# Patient Record
Sex: Male | Born: 1990 | Race: White | Hispanic: No | Marital: Single | Smoking: Never smoker
Health system: Southern US, Community
[De-identification: ages and names within clinical notes are randomized; demographics above are authoritative.]

## PROBLEM LIST (undated history)

## (undated) DIAGNOSIS — R569 Unspecified convulsions: Secondary | ICD-10-CM

## (undated) DIAGNOSIS — G40909 Epilepsy, unspecified, not intractable, without status epilepticus: Secondary | ICD-10-CM

## (undated) HISTORY — DX: Epilepsy, unspecified, not intractable, without status epilepticus: G40.909

## (undated) HISTORY — PX: TONSILLECTOMY: SUR1361

## (undated) HISTORY — PX: TYMPANOSTOMY TUBE PLACEMENT: SHX32

---
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
## 2017-10-07 ENCOUNTER — Encounter (HOSPITAL_BASED_OUTPATIENT_CLINIC_OR_DEPARTMENT_OTHER): Payer: Self-pay | Admitting: Emergency Medicine

## 2017-10-07 ENCOUNTER — Other Ambulatory Visit: Payer: Self-pay

## 2017-10-07 ENCOUNTER — Observation Stay (HOSPITAL_COMMUNITY): Payer: Self-pay

## 2017-10-07 ENCOUNTER — Emergency Department (HOSPITAL_BASED_OUTPATIENT_CLINIC_OR_DEPARTMENT_OTHER): Payer: Self-pay

## 2017-10-07 ENCOUNTER — Inpatient Hospital Stay (HOSPITAL_BASED_OUTPATIENT_CLINIC_OR_DEPARTMENT_OTHER)
Admission: EM | Admit: 2017-10-07 | Discharge: 2017-10-12 | DRG: 193 | Disposition: A | Discharge status: Home / Self Care | Payer: Self-pay | Attending: Internal Medicine | Admitting: Internal Medicine

## 2017-10-07 DIAGNOSIS — J9691 Respiratory failure, unspecified with hypoxia: Secondary | ICD-10-CM | POA: Diagnosis present

## 2017-10-07 DIAGNOSIS — R739 Hyperglycemia, unspecified: Secondary | ICD-10-CM

## 2017-10-07 DIAGNOSIS — Z8619 Personal history of other infectious and parasitic diseases: Secondary | ICD-10-CM

## 2017-10-07 DIAGNOSIS — R001 Bradycardia, unspecified: Secondary | ICD-10-CM

## 2017-10-07 DIAGNOSIS — R7303 Prediabetes: Secondary | ICD-10-CM | POA: Diagnosis present

## 2017-10-07 DIAGNOSIS — R945 Abnormal results of liver function studies: Secondary | ICD-10-CM | POA: Diagnosis present

## 2017-10-07 DIAGNOSIS — R0602 Shortness of breath: Secondary | ICD-10-CM

## 2017-10-07 DIAGNOSIS — Z881 Allergy status to other antibiotic agents status: Secondary | ICD-10-CM

## 2017-10-07 DIAGNOSIS — D6959 Other secondary thrombocytopenia: Secondary | ICD-10-CM | POA: Diagnosis present

## 2017-10-07 DIAGNOSIS — R0600 Dyspnea, unspecified: Secondary | ICD-10-CM

## 2017-10-07 DIAGNOSIS — R7989 Other specified abnormal findings of blood chemistry: Secondary | ICD-10-CM

## 2017-10-07 DIAGNOSIS — Z79899 Other long term (current) drug therapy: Secondary | ICD-10-CM

## 2017-10-07 DIAGNOSIS — J9601 Acute respiratory failure with hypoxia: Secondary | ICD-10-CM

## 2017-10-07 DIAGNOSIS — K5900 Constipation, unspecified: Secondary | ICD-10-CM | POA: Diagnosis present

## 2017-10-07 DIAGNOSIS — J181 Lobar pneumonia, unspecified organism: Principal | ICD-10-CM

## 2017-10-07 DIAGNOSIS — Z888 Allergy status to other drugs, medicaments and biological substances status: Secondary | ICD-10-CM

## 2017-10-07 DIAGNOSIS — J189 Pneumonia, unspecified organism: Secondary | ICD-10-CM

## 2017-10-07 DIAGNOSIS — D696 Thrombocytopenia, unspecified: Secondary | ICD-10-CM

## 2017-10-07 DIAGNOSIS — G40909 Epilepsy, unspecified, not intractable, without status epilepticus: Secondary | ICD-10-CM | POA: Diagnosis present

## 2017-10-07 HISTORY — DX: Unspecified convulsions: R56.9

## 2017-10-07 LAB — COMPREHENSIVE METABOLIC PANEL
ALBUMIN: 3.5 g/dL (ref 3.5–5.0)
ALT: 249 U/L — AB (ref 0–44)
AST: 245 U/L — AB (ref 15–41)
Alkaline Phosphatase: 54 U/L (ref 38–126)
Anion gap: 15 (ref 5–15)
BILIRUBIN TOTAL: 0.8 mg/dL (ref 0.3–1.2)
BUN: 12 mg/dL (ref 6–20)
CO2: 27 mmol/L (ref 22–32)
CREATININE: 0.94 mg/dL (ref 0.61–1.24)
Calcium: 8.3 mg/dL — ABNORMAL LOW (ref 8.9–10.3)
Chloride: 89 mmol/L — ABNORMAL LOW (ref 98–111)
Glucose, Bld: 124 mg/dL — ABNORMAL HIGH (ref 70–99)
POTASSIUM: 3.1 mmol/L — AB (ref 3.5–5.1)
Sodium: 131 mmol/L — ABNORMAL LOW (ref 135–145)
TOTAL PROTEIN: 7.5 g/dL (ref 6.5–8.1)

## 2017-10-07 LAB — URINALYSIS, ROUTINE W REFLEX MICROSCOPIC
Bilirubin Urine: NEGATIVE
Glucose, UA: NEGATIVE mg/dL
Hgb urine dipstick: NEGATIVE
KETONES UR: NEGATIVE mg/dL
Leukocytes, UA: NEGATIVE
NITRITE: NEGATIVE
PROTEIN: NEGATIVE mg/dL
Specific Gravity, Urine: <1.005 — ABNORMAL LOW (ref 1.005–1.030)
pH: 7 (ref 5.0–8.0)

## 2017-10-07 LAB — BLOOD GAS, ARTERIAL
ACID-BASE EXCESS: 6.9 mmol/L — AB (ref 0.0–2.0)
Bicarbonate: 31.3 mmol/L — ABNORMAL HIGH (ref 20.0–28.0)
Drawn by: 331471
FIO2: 55
O2 SAT: 92.3 %
PCO2 ART: 44.1 mmHg (ref 32.0–48.0)
Patient temperature: 37
pH, Arterial: 7.465 — ABNORMAL HIGH (ref 7.350–7.450)
pO2, Arterial: 63.6 mmHg — ABNORMAL LOW (ref 83.0–108.0)

## 2017-10-07 LAB — CBC WITH DIFFERENTIAL/PLATELET
BASOS ABS: 0.1 10*3/uL (ref 0.0–0.1)
Basophils Relative: 1 %
Eosinophils Absolute: 0 10*3/uL (ref 0.0–0.7)
Eosinophils Relative: 0 %
HCT: 42.4 % (ref 39.0–52.0)
Hemoglobin: 15.3 g/dL (ref 13.0–17.0)
LYMPHS ABS: 1.2 10*3/uL (ref 0.7–4.0)
Lymphocytes Relative: 18 %
MCH: 30.8 pg (ref 26.0–34.0)
MCHC: 36.1 g/dL — ABNORMAL HIGH (ref 30.0–36.0)
MCV: 85.3 fL (ref 78.0–100.0)
MONO ABS: 0.8 10*3/uL (ref 0.1–1.0)
Monocytes Relative: 13 %
NEUTROS PCT: 68 %
Neutro Abs: 4.4 10*3/uL (ref 1.7–7.7)
PLATELETS: 117 10*3/uL — AB (ref 150–400)
RBC: 4.97 MIL/uL (ref 4.22–5.81)
RDW: 12.2 % (ref 11.5–15.5)
WBC: 6.5 10*3/uL (ref 4.0–10.5)

## 2017-10-07 LAB — EXPECTORATED SPUTUM ASSESSMENT W REFEX TO RESP CULTURE

## 2017-10-07 LAB — MRSA PCR SCREENING: MRSA by PCR: NEGATIVE

## 2017-10-07 LAB — EXPECTORATED SPUTUM ASSESSMENT W GRAM STAIN, RFLX TO RESP C

## 2017-10-07 LAB — VALPROIC ACID LEVEL: Valproic Acid Lvl: 67 ug/mL (ref 50.0–100.0)

## 2017-10-07 LAB — I-STAT CG4 LACTIC ACID, ED: Lactic Acid, Venous: 1.58 mmol/L (ref 0.5–1.9)

## 2017-10-07 MED ORDER — PHENOL 1.4 % MT LIQD
1.0000 | OROMUCOSAL | Status: DC | PRN
  Filled 2017-10-07: qty 177

## 2017-10-07 MED ORDER — VANCOMYCIN HCL IN DEXTROSE 1-5 GM/200ML-% IV SOLN
1000.0000 mg | Freq: Three times a day (TID) | INTRAVENOUS | Status: DC
  Administered 2017-10-07 – 2017-10-08 (×3): 1000 mg via INTRAVENOUS
  Filled 2017-10-07 (×3): qty 200

## 2017-10-07 MED ORDER — DIVALPROEX SODIUM ER 500 MG PO TB24
1500.0000 mg | ORAL_TABLET | Freq: Every day | ORAL | Status: DC
  Administered 2017-10-07 – 2017-10-11 (×5): 1500 mg via ORAL
  Filled 2017-10-07: qty 3
  Filled 2017-10-07: qty 6
  Filled 2017-10-07: qty 3
  Filled 2017-10-07 (×2): qty 6

## 2017-10-07 MED ORDER — IPRATROPIUM-ALBUTEROL 0.5-2.5 (3) MG/3ML IN SOLN
3.0000 mL | Freq: Once | RESPIRATORY_TRACT | Status: AC
  Administered 2017-10-07: 3 mL via RESPIRATORY_TRACT

## 2017-10-07 MED ORDER — LACTATED RINGERS IV BOLUS
1000.0000 mL | Freq: Once | INTRAVENOUS | Status: AC
  Administered 2017-10-07: 1000 mL via INTRAVENOUS

## 2017-10-07 MED ORDER — VANCOMYCIN HCL IN DEXTROSE 1-5 GM/200ML-% IV SOLN
1000.0000 mg | INTRAVENOUS | Status: AC
  Administered 2017-10-07: 1000 mg via INTRAVENOUS

## 2017-10-07 MED ORDER — SODIUM CHLORIDE 0.9 % IV SOLN
INTRAVENOUS | Status: DC
  Administered 2017-10-07: 21:00:00 via INTRAVENOUS
  Administered 2017-10-08: 1000 mL via INTRAVENOUS
  Administered 2017-10-09: 05:00:00 via INTRAVENOUS

## 2017-10-07 MED ORDER — ENOXAPARIN SODIUM 40 MG/0.4ML ~~LOC~~ SOLN
40.0000 mg | SUBCUTANEOUS | Status: DC
  Administered 2017-10-11: 40 mg via SUBCUTANEOUS
  Filled 2017-10-07 (×4): qty 0.4

## 2017-10-07 MED ORDER — POTASSIUM CHLORIDE CRYS ER 20 MEQ PO TBCR
40.0000 meq | EXTENDED_RELEASE_TABLET | Freq: Once | ORAL | Status: AC
  Administered 2017-10-07: 40 meq via ORAL
  Filled 2017-10-07: qty 2

## 2017-10-07 MED ORDER — PREDNISONE 20 MG PO TABS
20.0000 mg | ORAL_TABLET | Freq: Two times a day (BID) | ORAL | Status: AC
  Administered 2017-10-07 – 2017-10-12 (×10): 20 mg via ORAL
  Filled 2017-10-07 (×10): qty 1

## 2017-10-07 MED ORDER — ONDANSETRON HCL 4 MG/2ML IJ SOLN: 4.0000 mg | Freq: Four times a day (QID) | INTRAMUSCULAR | Status: DC | PRN

## 2017-10-07 MED ORDER — LEVOFLOXACIN IN D5W 750 MG/150ML IV SOLN
750.0000 mg | Freq: Once | INTRAVENOUS | Status: DC
  Filled 2017-10-07: qty 150

## 2017-10-07 MED ORDER — VANCOMYCIN HCL IN DEXTROSE 1-5 GM/200ML-% IV SOLN
1000.0000 mg | Freq: Once | INTRAVENOUS | Status: DC
  Filled 2017-10-07: qty 200

## 2017-10-07 MED ORDER — DIVALPROEX SODIUM ER 500 MG PO TB24
1000.0000 mg | ORAL_TABLET | Freq: Two times a day (BID) | ORAL | Status: DC
  Administered 2017-10-07 – 2017-10-12 (×10): 1000 mg via ORAL
  Filled 2017-10-07: qty 4
  Filled 2017-10-07 (×3): qty 2
  Filled 2017-10-07 (×6): qty 4

## 2017-10-07 MED ORDER — SODIUM CHLORIDE 0.9 % IV SOLN: INTRAVENOUS | Status: DC | PRN

## 2017-10-07 MED ORDER — ACETAMINOPHEN 650 MG RE SUPP: 650.0000 mg | Freq: Four times a day (QID) | RECTAL | Status: DC | PRN

## 2017-10-07 MED ORDER — IPRATROPIUM-ALBUTEROL 0.5-2.5 (3) MG/3ML IN SOLN: 3.0000 mL | RESPIRATORY_TRACT | Status: DC | PRN

## 2017-10-07 MED ORDER — GUAIFENESIN-CODEINE 100-10 MG/5ML PO SOLN
5.0000 mL | ORAL | Status: DC | PRN
  Administered 2017-10-07 – 2017-10-11 (×15): 5 mL via ORAL
  Filled 2017-10-07 (×15): qty 5

## 2017-10-07 MED ORDER — LEVETIRACETAM 500 MG PO TABS
1500.0000 mg | ORAL_TABLET | Freq: Every day | ORAL | Status: DC
  Administered 2017-10-08 – 2017-10-12 (×5): 1500 mg via ORAL
  Filled 2017-10-07 (×7): qty 3

## 2017-10-07 MED ORDER — ACETAMINOPHEN 325 MG PO TABS: 650.0000 mg | ORAL_TABLET | Freq: Four times a day (QID) | ORAL | Status: DC | PRN

## 2017-10-07 MED ORDER — MENTHOL 3 MG MT LOZG
1.0000 | LOZENGE | OROMUCOSAL | Status: DC | PRN
  Administered 2017-10-08 – 2017-10-09 (×2): 3 mg via ORAL
  Filled 2017-10-07 (×2): qty 9

## 2017-10-07 MED ORDER — ORAL CARE MOUTH RINSE
15.0000 mL | Freq: Two times a day (BID) | OROMUCOSAL | Status: DC
  Administered 2017-10-07 – 2017-10-09 (×3): 15 mL via OROMUCOSAL

## 2017-10-07 MED ORDER — ACETAMINOPHEN 500 MG PO TABS
1000.0000 mg | ORAL_TABLET | Freq: Once | ORAL | Status: AC
  Administered 2017-10-07: 1000 mg via ORAL
  Filled 2017-10-07: qty 2

## 2017-10-07 MED ORDER — LEVOFLOXACIN IN D5W 750 MG/150ML IV SOLN
750.0000 mg | INTRAVENOUS | Status: DC
  Administered 2017-10-08: 750 mg via INTRAVENOUS
  Filled 2017-10-07: qty 150

## 2017-10-07 MED ORDER — LEVOFLOXACIN IN D5W 750 MG/150ML IV SOLN
750.0000 mg | INTRAVENOUS | Status: AC
  Administered 2017-10-07: 750 mg via INTRAVENOUS

## 2017-10-07 MED ORDER — LEVETIRACETAM 500 MG PO TABS
1000.0000 mg | ORAL_TABLET | ORAL | Status: DC
  Administered 2017-10-07 – 2017-10-11 (×10): 1000 mg via ORAL
  Filled 2017-10-07 (×8): qty 2

## 2017-10-07 NOTE — Consult Note (Addendum)
NAME:  John Mccoy, MRN:  [id number], DOB:  03/04/1990, LOS: 0 ADMISSION DATE:  10/07/2017, CONSULTATION DATE: 10/07/2017 REFERRING MD: Dr. Elvera LennoxGherghe, CHIEF COMPLAINT: Shortness of breath  Brief History   Patient was admitted with worsening shortness of breath Was recently treated for influenza B--completed a course of Tamiflu His symptoms of sore throat, cough, shortness of breath continue to persist Ongoing fevers chills led to him presenting to the emergency department Was noted to be tachycardic and tachypneic Significant Hospital Events   Continue to deteriorate in the emergency department requiring nonrebreather mask He had initially started on nasal cannula  Consults: date of consult/date signed off & final recs:   Procedures (surgical and bedside):  None Significant Diagnostic Tests:  ABG noted for significant hypoxemia  Chest x-ray reviewed by myself showing multifocal basal infiltrates  Micro Data: Blood culture x2 on 10/07/2017  Antimicrobials:  Vancomycin 10/07/2017>> Levaquin 10/07/2017>>  Subjective:  He feels relatively well Still has nausea Feels quite fatigued  Objective   Blood pressure 132/76, pulse 83, temperature 99.5 F (37.5 C), temperature source Oral, resp. rate (!) 25, height 6\' 1"  (1.854 m), weight 102.1 kg, SpO2 94 %.    FiO2 (%):  [50 %-100 %] 100 %   Intake/Output Summary (Last 24 hours) at 10/07/2017 1701 Last data filed at 10/07/2017 1600 Gross per 24 hour  Intake 1150.81 ml  Output 950 ml  Net 200.81 ml   Filed Weights   10/07/17 0800  Weight: 102.1 kg    Examination: General: Young gentleman, does not appear to be in respiratory distress HENT: Moist oral mucosa Lungs: Good entry bilaterally, rales at the bases Cardiovascular: S1-S2 appreciated, no murmur Abdomen: Bowel sounds appreciated Extremities: No edema, no clubbing Neuro: Alert oriented x3, moving all extremities GU: Good urine output  Resolved Hospital Problem  list    Assessment & Plan:   Hypoxemic respiratory failure Recent influenza infection Possible bacterial infection Extensive infiltrate   Multilobar pneumonia Continue dual antibiotics, vancomycin and Levaquin  Influenza B infection Continue Tamiflu  Seizure disorder Continue seizure  meds    For his hypoxemia/pneumonitis Add steroids-prednisone 20 p.o. twice daily  Wean oxygen as tolerated  Risk of decompensation is high as patient had to be escalated from requiring liters nasal cannula up to using nonrebreather mask to maintain saturations over 90%  Risk of requiring a ventilator does exist-will continue to monitor closely  Disposition / Summary of Today's Plan 10/07/17   Added steroids Continue antibiotics    Diet: As tolerated Pain/Anxiety/Delirium protocol (if indicated): Not applicable VAP protocol (if indicated) not applicable DVT prophylaxis: May ambulate as tolerated GI prophylaxis: Tolerating orally Hyperglycemia protocol: Applicable Mobility: As tolerated Code Status: full Code  Labs   CBC: Recent Labs  Lab 10/07/17 0802  WBC 6.5  NEUTROABS 4.4  HGB 15.3  HCT 42.4  MCV 85.3  PLT 117*   Basic Metabolic Panel: Recent Labs  Lab 10/07/17 0802  NA 131*  K 3.1*  CL 89*  CO2 27  GLUCOSE 124*  BUN 12  CREATININE 0.94  CALCIUM 8.3*   GFR: Estimated Creatinine Clearance: 149.6 mL/min (by C-G formula based on SCr of 0.94 mg/dL). Recent Labs  Lab 10/07/17 0802  WBC 6.5  LATICACIDVEN 1.58   Liver Function Tests: Recent Labs  Lab 10/07/17 0802  AST 245*  ALT 249*  ALKPHOS 54  BILITOT 0.8  PROT 7.5  ALBUMIN 3.5   No results for input(s): LIPASE, AMYLASE in the last 168 hours. No  results for input(s): AMMONIA in the last 168 hours. ABG    Component Value Date/Time   PHART 7.465 (H) 10/07/2017 1601   PCO2ART 44.1 10/07/2017 1601   PO2ART 63.6 (L) 10/07/2017 1601   HCO3 31.3 (H) 10/07/2017 1601   O2SAT 92.3 10/07/2017 1601      Coagulation Profile: No results for input(s): INR, PROTIME in the last 168 hours. Cardiac Enzymes: No results for input(s): CKTOTAL, CKMB, CKMBINDEX, TROPONINI in the last 168 hours. HbA1C: No results found for: HGBA1C CBG: No results for input(s): GLUCAP in the last 168 hours.  Admitting History of Present Illness.   As above Review of Systems:   Review of Systems  Constitutional: Positive for fever and malaise/fatigue.  Respiratory: Positive for cough and shortness of breath.   All other systems reviewed and are negative.   Past medical history  He,  has a past medical history of Seizures (HCC).   Surgical History   History reviewed. No pertinent surgical history.   Social History   Social History   Socioeconomic History  . Marital status: Single    Spouse name: Not on file  . Number of children: Not on file  . Years of education: Not on file  . Highest education level: Not on file  Occupational History  . Not on file  Social Needs  . Financial resource strain: Not on file  . Food insecurity:    Worry: Not on file    Inability: Not on file  . Transportation needs:    Medical: Not on file    Non-medical: Not on file  Tobacco Use  . Smoking status: Never Smoker  . Smokeless tobacco: Never Used  Substance and Sexual Activity  . Alcohol use: Not Currently  . Drug use: Not Currently  . Sexual activity: Not on file  Lifestyle  . Physical activity:    Days per week: Not on file    Minutes per session: Not on file  . Stress: Not on file  Relationships  . Social connections:    Talks on phone: Not on file    Gets together: Not on file    Attends religious service: Not on file    Active member of club or organization: Not on file    Attends meetings of clubs or organizations: Not on file    Relationship status: Not on file  . Intimate partner violence:    Fear of current or ex partner: Not on file    Emotionally abused: Not on file    Physically abused:  Not on file    Forced sexual activity: Not on file  Other Topics Concern  . Not on file  Social History Narrative  . Not on file  ,  reports that he has never smoked. He has never used smokeless tobacco. He reports that he drank alcohol. He reports that he has current or past drug history.   Family history   His family history is not on file.   Allergies Allergies  Allergen Reactions  . Other Swelling    Eyes, nose & mouth swell shut NSAIDS  . Daucus Carota Itching and Swelling    Also Cantalope; Celery  . Azithromycin Rash    Home meds  Prior to Admission medications   Medication Sig Start Date End Date Taking? Authorizing Provider  divalproex (DEPAKOTE ER) 500 MG 24 hr tablet Take 1,000-1,500 mg by mouth 3 (three) times daily. Per Pharmacy 1000mg  QAM, 1000mg  midday, 1500mg  QPM Per Pt 1500mg   QAM, 1000mg  midday, 1500mg  QPM 02/23/17  Yes [provider]  levETIRAcetam (KEPPRA) 1000 MG tablet Take 1,000-1,500 mg by mouth 3 (three) times daily. 1500mg  QAM & 1000mg  Q afternoon & 1000mg  QPM 07/19/17  Yes [provider]  oseltamivir (TAMIFLU) 75 MG capsule Take 75 mg by mouth 2 (two) times daily.  09/30/17   [provider]

## 2017-10-07 NOTE — Progress Notes (Signed)
Pharmacy Antibiotic Note  John Mccoy is a 27 y.o. male admitted on 10/07/2017 with sepsis.  Pharmacy has been consulted for Vanco/Levaquin dosing.  CC/HPI: Flu last week, now worsening with continued fever, hypoxia, increased WOB and SOB, weakness, dizziness  PMH: seizures,   Anticoag: Plts only 117 ID: Temp 103.1. WBC 6.5.  GI/Nutrition: AST/ALT 245/249 elevated. Tbili WNL. Albumin 3.5 ok. Neuro: 9/21 VA level 67 Renal: Scr 0.94. K 3.1 low  Plan: Vanco 1g IV x 1 then q 8 hrs Levaquin 750mg  IV x 1 and q 24h   Height: 6\' 1"  (185.4 cm) Weight: 225 lb (102.1 kg) IBW/kg (Calculated) : 79.9  Temp (24hrs), Avg:103.1 F (39.5 C), Min:103.1 F (39.5 C), Max:103.1 F (39.5 C)  Recent Labs  Lab 10/07/17 0802  WBC 6.5  CREATININE 0.94  LATICACIDVEN 1.58    Estimated Creatinine Clearance: 149.6 mL/min (by C-G formula based on SCr of 0.94 mg/dL).    Allergies  Allergen Reactions  . Other Swelling    Eyes, nose & mouth swell shut  . Daucus Carota Itching and Swelling    Also Cantalope; Celery  . Azithromycin Rash    John Mccoy, PharmD, BCPS Clinical Staff Pharmacist [phone]  John Mccoy, John Mccoy [facility] 10/07/2017 10:09 AM

## 2017-10-07 NOTE — H&P (Addendum)
History and Physical    John Mccoy John Mccoy DOB: 01/11/1991 DOA: 10/07/2017  I [facility] briefly [facility]  PCP: Patient, No Pcp Per  Patient coming from: home  Chief Complaint: shortness of breath   HPI: John Mccoy is a 27 y.o. male with medical history significant of seizure disorder  On keppra and depakote, otherwise healthy presents to the ER with complaints of shortness of breath. Patient had URI last week and was diagnosed with influenza B at a urgent care and was placed on Tamiflu. He took it as prescribed and finished a 5 day course. Despite the antiviral, his URI / sore throat / cough persisted, he continued to feel poorly and started being very short of breath and came to the hospital. He also reports crampy abdominal pain and diarrhea for the past 4-5 days. No nausea or vomiting. He complains of fevers and chills, ongoing muscle aches and weakness/fatigue.   ED Course: On arrival to the ED he is febrile, tachycardic and hypoxic requiring initially nasal cannula, then ventimask and eventually needed to be placed on a NRB with good O2 sats. CXR revealed multifocal PNA. He was placed on Vancomycin and Levaquin and we were asked to admit.   Review of Systems: As per HPI otherwise 10 point review of systems negative.   Past Medical History:  Diagnosis Date  . Seizures (HCC)     History reviewed. No pertinent surgical history.   reports that he has never smoked. He has never used smokeless tobacco. He reports that he drank alcohol. He reports that he has current or past drug history.  Allergies  Allergen Reactions  . Other Swelling    Eyes, nose & mouth swell shut  . Daucus Carota Itching and Swelling    Also Cantalope; Celery  . Azithromycin Rash    History reviewed. No pertinent family history.  Prior to Admission medications   Medication Sig Start Date End Date Taking? Authorizing Provider    divalproex (DEPAKOTE ER) 500 MG 24 hr tablet Take 2 pills in morning , 2 pills at midday and 3 pills at night. Please use the [employer] manufacturer each time as medically necessary. 02/23/17  Yes [provider]  b complex vitamins tablet Take by mouth.    [provider]  divalproex (DEPAKOTE ER) 500 MG 24 hr tablet  07/12/17   [provider]  oseltamivir (TAMIFLU) 75 MG capsule TK 1 C PO BID           TK ONE C PO BID 09/30/17   [provider]    Physical Exam: Vitals:   10/07/17 1330 10/07/17 1400 10/07/17 1430 10/07/17 1534  BP: 124/87 116/71 117/70 132/76  Pulse: 76 76 73 85  Resp: (!) 24 (!) 36 (!) 24 (!) 28  Temp:    99.5 F (37.5 C)  TempSrc:    Oral  SpO2: 96% 96% 99% 94%  Weight:      Height:        Constitutional: Patient in mild distress, tachypneic, appears tired Eyes: PERRL, lids and conjunctivae normal ENMT: Mucous membranes are moist. Posterior pharynx erythematous Neck: normal, supple, no masses, no thyromegaly Respiratory: Coarse breath sounds bilaterally, no wheezing or crackles heard.  Increased respiratory effort Cardiovascular: Regular rate and rhythm, no murmurs / rubs / gallops.  Tachycardic.  No extremity edema. 2+ pedal pulses.  Abdomen: no tenderness, no masses palpated. Bowel sounds positive.  Musculoskeletal: no clubbing /  cyanosis. Normal muscle tone.  Skin: no rashes, lesions, ulcers. No induration Neurologic: CN 2-12 grossly intact. Strength 5/5 in all 4.  Psychiatric: Normal judgment and insight. Alert and oriented x 3. Normal mood.   Labs on Admission: I have personally reviewed following labs and imaging studies  CBC: Recent Labs  Lab 10/07/17 0802  WBC 6.5  NEUTROABS 4.4  HGB 15.3  HCT 42.4  MCV 85.3  PLT 117*   Basic Metabolic Panel: Recent Labs  Lab 10/07/17 0802  NA 131*  K 3.1*  CL 89*  CO2 27  GLUCOSE 124*  BUN 12  CREATININE 0.94  CALCIUM 8.3*   GFR: Estimated  Creatinine Clearance: 149.6 mL/min (by C-G formula based on SCr of 0.94 mg/dL). Liver Function Tests: Recent Labs  Lab 10/07/17 0802  AST 245*  ALT 249*  ALKPHOS 54  BILITOT 0.8  PROT 7.5  ALBUMIN 3.5   No results for input(s): LIPASE, AMYLASE in the last 168 hours. No results for input(s): AMMONIA in the last 168 hours. Coagulation Profile: No results for input(s): INR, PROTIME in the last 168 hours. Cardiac Enzymes: No results for input(s): CKTOTAL, CKMB, CKMBINDEX, TROPONINI in the last 168 hours. BNP (last 3 results) No results for input(s): PROBNP in the last 8760 hours. HbA1C: No results for input(s): HGBA1C in the last 72 hours. CBG: No results for input(s): GLUCAP in the last 168 hours. Lipid Profile: No results for input(s): CHOL, HDL, LDLCALC, TRIG, CHOLHDL, LDLDIRECT in the last 72 hours. Thyroid Function Tests: No results for input(s): TSH, T4TOTAL, FREET4, T3FREE, THYROIDAB in the last 72 hours. Anemia Panel: No results for input(s): VITAMINB12, FOLATE, FERRITIN, TIBC, IRON, RETICCTPCT in the last 72 hours. Urine analysis:    Component Value Date/Time   COLORURINE YELLOW 10/07/2017 0744   APPEARANCEUR CLEAR 10/07/2017 0744   LABSPEC <1.005 (L) 10/07/2017 0744   PHURINE 7.0 10/07/2017 0744   GLUCOSEU NEGATIVE 10/07/2017 0744   HGBUR NEGATIVE 10/07/2017 0744   BILIRUBINUR NEGATIVE 10/07/2017 0744   KETONESUR NEGATIVE 10/07/2017 0744   PROTEINUR NEGATIVE 10/07/2017 0744   NITRITE NEGATIVE 10/07/2017 0744   LEUKOCYTESUR NEGATIVE 10/07/2017 0744     Radiological Exams on Admission: Dg Chest 2 View  Result Date: 10/07/2017 CLINICAL DATA:  Flu. Fever, increased work of breathing, shortness of breath, hypoxia. EXAM: CHEST - 2 VIEW COMPARISON:  None. FINDINGS: Multifocal patchy opacities in the lingula and bilateral lower lobes, suspicious for pneumonia. No pleural effusion or pneumothorax. The heart is normal in size. Visualized osseous structures are within  normal limits. IMPRESSION: Multifocal pneumonia, left lung predominant. Electronically Signed   By: Charline Bills M.D.   On: 10/07/2017 08:40    EKG: Independently reviewed. Sinus tach  Assessment/Plan Active Problems:   Pneumonia   Sepsis due to multifocal pneumonia -Patient recently diagnosed with influenza and finished a course of Tamiflu.  There is no role to continue Tamiflu at this point, obtain respiratory virus panel to evaluate for potential additional viral coinfection -Placed on broad-spectrum antibiotics of vancomycin and Levaquin, continue  Acute hypoxic respiratory failure -Concern for progressing to ARDS, ABG shows profound hypoxia with an oxygen level of 63 on 100% nonrebreather.   -later after admission was able to be weaned to high flow, he is comfortable -pulmonary evaluated patient, appreciate input  Elevated LFTs  -possibly post influenza, repeat in am. No RUQ tenderness. No ETOH.  Seizure disorder -continue home medications   DVT prophylaxis: Lovenox  Code Status: Full code  Family Communication:  no family at bedside Disposition Plan: TBD Consults called: PCCM     Admission status: Inpatient    At the time of admission, it appears that the appropriate admission status for this patient is INPATIENT. This is judged to be reasonable and necessary in order to provide the required high service intensity to ensure the patient's safety given the presenting symptoms, physical exam findings, and initial radiographic and laboratory data in the context of their chronic comorbidities. Current circumstances are hypoxic respiratory failure, pneumonia, influenza, and it is felt to place patient at high risk for further clinical deterioration threatening life, limb, or organ. Moreover, it is my clinical judgment that the patient will require inpatient hospital care spanning beyond 2 midnights from the point of admission and that early discharge would result in  unnecessary risk of decompensation and readmission or threat to life, limb or bodily function.   Pamella Pert, MD [facility] Pager [phone]  If 7PM-7AM, please contact night-coverage [email or web address] Password [facility]  10/07/2017, 3:48 PM

## 2017-10-07 NOTE — ED Triage Notes (Signed)
Patient states that he was dx with the flu last week and now is getting worse. The patient still has fever, RA sats low - increased WOB and SOB Patient weak and dizzy

## 2017-10-07 NOTE — ED Provider Notes (Addendum)
[facility] EMERGENCY DEPARTMENT Provider Note   CSN: [id number] Arrival date & time: 10/07/17  [id number]     History   Chief Complaint Chief Complaint  Patient presents with  . Fever    HPI John Mccoy is a 27 y.o. male.  HPI  27 year old male with a history of seizures who is on Keppra and Depakote presents with feeling weak.  On 9/13 he started developing flulike symptoms and went to urgent care on 9/14.  He states he had a positive flu test and was placed on Tamiflu which he took.  Since then he is been feeling worse including myalgias, subjective fever, cough with green sputum and occasional red, vomiting, diarrhea, shortness of breath and overall weakness.  He is been feeling lightheaded and dizzy.  He states when he closes his eyes he will see some hallucinations but he cannot describe what exactly it looks like.  No hallucinations when eyes are awake.  He does not smoke or vape.  He denies any other medical problems.  Past Medical History:  Diagnosis Date  . Seizures [facility](HCC)     Patient Active Problem List   Diagnosis Date Noted  . Pneumonia 10/07/2017    History reviewed. No pertinent surgical history.      Home Medications    Prior to Admission medications   Medication Sig Start Date End Date Taking? Authorizing Provider  divalproex (DEPAKOTE ER) 500 MG 24 hr tablet Take 2 pills in morning , 2 pills at midday and 3 pills at night. Please use the [employer] manufacturer each time as medically necessary. 02/23/17  Yes [provider]  b complex vitamins tablet Take by mouth.    [provider]  divalproex (DEPAKOTE ER) 500 MG 24 hr tablet  07/12/17   [provider]  oseltamivir (TAMIFLU) 75 MG capsule TK 1 C PO BID           TK ONE C PO BID 09/30/17   [provider]    Family History History reviewed. No pertinent family history.  Social History Social History   Tobacco Use  . Smoking status:  Never Smoker  . Smokeless tobacco: Never Used  Substance Use Topics  . Alcohol use: Not Currently  . Drug use: Not Currently     Allergies   Other; Daucus carota; and Azithromycin   Review of Systems Review of Systems  Constitutional: Positive for fever.  HENT: Positive for rhinorrhea.   Respiratory: Positive for shortness of breath.   Gastrointestinal: Positive for diarrhea and vomiting. Negative for abdominal pain.  Musculoskeletal: Negative for neck stiffness.  Neurological: Positive for dizziness, weakness, light-headedness and headaches.  All other systems reviewed and are negative.    Physical Exam Updated Vital Signs BP 120/74   Pulse 94   Temp (!) 103.1 F (39.5 C) (Oral)   Resp (!) 30   Ht 6\' 1"  (1.854 m)   Wt 102.1 kg   SpO2 94%   BMI 29.69 kg/m   Physical Exam  Constitutional: He is oriented to person, place, and time. He appears well-developed and well-nourished. No distress.  Has just been started on duoneb  HENT:  Head: Normocephalic and atraumatic.  Right Ear: External ear normal.  Left Ear: External ear normal.  Nose: Nose normal.  Mouth/Throat: No oropharyngeal exudate.  Eyes: Right eye exhibits no discharge. Left eye exhibits no discharge.  Neck: Normal range of motion. Neck supple. No neck rigidity.  Cardiovascular: Regular rhythm and normal heart  sounds. Tachycardia present.  Pulmonary/Chest: Effort normal. No accessory muscle usage. Tachypnea noted. He has decreased breath sounds in the right lower field. He has rales in the right lower field.  Abdominal: Soft. There is no tenderness.  Musculoskeletal: He exhibits no edema.  Neurological: He is alert and oriented to person, place, and time.  Normal speech, no facial droop. 5/5 strength in all 4 extremities  Skin: Skin is warm and dry. He is not diaphoretic.  Nursing note and vitals reviewed.    ED Treatments / Results  Labs (all labs ordered are listed, but only abnormal results are  displayed) Labs Reviewed  COMPREHENSIVE METABOLIC PANEL - Abnormal; Notable for the following components:      Result Value   Sodium 131 (*)    Potassium 3.1 (*)    Chloride 89 (*)    Glucose, Bld 124 (*)    Calcium 8.3 (*)    AST 245 (*)    ALT 249 (*)    All other components within normal limits  CBC WITH DIFFERENTIAL/PLATELET - Abnormal; Notable for the following components:   MCHC 36.1 (*)    Platelets 117 (*)    All other components within normal limits  CULTURE, BLOOD (ROUTINE X 2)  CULTURE, BLOOD (ROUTINE X 2)  VALPROIC ACID LEVEL  URINALYSIS, ROUTINE W REFLEX MICROSCOPIC  I-STAT CG4 LACTIC ACID, ED  I-STAT CG4 LACTIC ACID, ED    EKG EKG Interpretation  Date/Time:  Saturday October 07 2017 07:50:29 EDT Ventricular Rate:  115 PR Interval:    QRS Duration: 98 QT Interval:  313 QTC Calculation: 433 R Axis:   35 Text Interpretation:  Sinus tachycardia RSR' in V1 or V2, probably normal variant Nonspecific T abnormalities, anterior leads rate is faster along with new ST/T changes since April 2010 Confirmed by Pricilla Loveless [phone]) on 10/07/2017 7:57:28 AM   Radiology Dg Chest 2 View  Result Date: 10/07/2017 CLINICAL DATA:  Flu. Fever, increased work of breathing, shortness of breath, hypoxia. EXAM: CHEST - 2 VIEW COMPARISON:  None. FINDINGS: Multifocal patchy opacities in the lingula and bilateral lower lobes, suspicious for pneumonia. No pleural effusion or pneumothorax. The heart is normal in size. Visualized osseous structures are within normal limits. IMPRESSION: Multifocal pneumonia, left lung predominant. Electronically Signed   By: Charline Bills M.D.   On: 10/07/2017 08:40    Procedures .Critical Care Performed by: Pricilla Loveless, MD Authorized by: Pricilla Loveless, MD   Critical care provider statement:    Critical care time (minutes):  35   Critical care time was exclusive of:  Separately billable procedures and treating other patients   Critical  care was necessary to treat or prevent imminent or life-threatening deterioration of the following conditions:  Respiratory failure and sepsis   Critical care was time spent personally by me on the following activities:  Development of treatment plan with patient or surrogate, discussions with consultants, evaluation of patient's response to treatment, examination of patient, obtaining history from patient or surrogate, ordering and performing treatments and interventions, ordering and review of laboratory studies, ordering and review of radiographic studies, pulse oximetry, re-evaluation of patient's condition and review of old charts   (including critical care time)  Medications Ordered in ED Medications  vancomycin (VANCOCIN) IVPB 1000 mg/200 mL premix (1,000 mg Intravenous New Bag/Given 10/07/17 0837)  levofloxacin (LEVAQUIN) IVPB 750 mg (750 mg Intravenous New Bag/Given 10/07/17 0843)  0.9 %  sodium chloride infusion (has no administration in time range)  potassium chloride  SA (K-DUR,KLOR-CON) CR tablet 40 mEq (has no administration in time range)  ipratropium-albuterol (DUONEB) 0.5-2.5 (3) MG/3ML nebulizer solution 3 mL (3 mLs Nebulization Given 10/07/17 0744)  lactated ringers bolus 1,000 mL (0 mLs Intravenous Stopped 10/07/17 0854)  acetaminophen (TYLENOL) tablet 1,000 mg (1,000 mg Oral Given 10/07/17 0830)     Initial Impression / Assessment and Plan / ED Course  I have reviewed the triage vital signs and the nursing notes.  Pertinent labs & imaging results that were available during my care of the patient were reviewed by me and considered in my medical decision making (see chart for details).     Patient did not do well with the nasal cannula and had to be put on a Ventimask because he is breathing through his mouth only.  His hypoxia is almost undoubtedly from the multifocal pneumonia seen on chest x-ray.  Per protocol, he was covered with vancomycin given his recent flulike illness to  help cover for MRSA.  With his azithromycin allergy, I think Levaquin would be the best medicine to cover other organisms.  He was given IV fluids and Tylenol.  He is maintaining his airway well and does not appear to be in distress.  Most of his symptoms are likely explained from the overall illness and the hypoxia.  He is not currently hallucinating.  Mild hypokalemia repleted.  He will need admission for respiratory support.  Discussed with Dr. Elvera Lennox, who will admit to [facility] stepdown.  2:02 PM Patient is still waiting on his bed and transport.  He has remained hypoxic but stable while on the Ventimask.  However when he had to go to the bathroom on the portable commode, he seemed to drop his sats.  At one point, nursing staff noted he seemed confused but he was also satting 85% at the time.  When I am reexamining him, his lung exam is unchanged and he is currently resting comfortably on the nonrebreather at 100% oxygen.  He has been weaned down to the Ventimask again and currently at 50% is still satting above 93%.  He is not altered on my exam.  I do not think he needs emergent intubation or BiPAP at this time but he probably has very low oxygen reserve given the pneumonia.  Plan for continued admission to stepdown and I updated the hospitalist.  2:43 PM [facility] here to transport. Has remained stable on Ventimask. Appears stable for transport. No respiratory distress.  Final Clinical Impressions(s) / ED Diagnoses   Final diagnoses:  Pneumonia of both lower lobes due to infectious organism [facility])  Acute respiratory failure with hypoxia [facility])    ED Discharge Orders    None       Pricilla Loveless, MD 10/07/17 1610    Pricilla Loveless, MD 10/07/17 1443

## 2017-10-07 NOTE — ED Notes (Signed)
Attempted report 

## 2017-10-07 NOTE — ED Notes (Addendum)
RN at bedside, O2 sats 85%, pt confused and restless  on Venti mask, RT called to bedside. Placed on NRB at 15 liters, Dr. Criss AlvineGoldston notified. SaO2 increased to 100%, confusion improved.

## 2017-10-08 LAB — COMPREHENSIVE METABOLIC PANEL
ALT: 208 U/L — ABNORMAL HIGH (ref 0–44)
AST: 174 U/L — AB (ref 15–41)
Albumin: 2.9 g/dL — ABNORMAL LOW (ref 3.5–5.0)
Alkaline Phosphatase: 45 U/L (ref 38–126)
Anion gap: 11 (ref 5–15)
BUN: 9 mg/dL (ref 6–20)
CHLORIDE: 97 mmol/L — AB (ref 98–111)
CO2: 30 mmol/L (ref 22–32)
Calcium: 8.5 mg/dL — ABNORMAL LOW (ref 8.9–10.3)
Creatinine, Ser: 0.68 mg/dL (ref 0.61–1.24)
GFR calc Af Amer: >60 mL/min (ref >60)
GFR calc non Af Amer: >60 mL/min (ref >60)
GLUCOSE: 118 mg/dL — AB (ref 70–99)
Potassium: 4.3 mmol/L (ref 3.5–5.1)
Sodium: 138 mmol/L (ref 135–145)
Total Bilirubin: 0.7 mg/dL (ref 0.3–1.2)
Total Protein: 6.5 g/dL (ref 6.5–8.1)

## 2017-10-08 LAB — CBC
HCT: 39.7 % (ref 39.0–52.0)
HEMOGLOBIN: 13.7 g/dL (ref 13.0–17.0)
MCH: 30.6 pg (ref 26.0–34.0)
MCHC: 34.5 g/dL (ref 30.0–36.0)
MCV: 88.6 fL (ref 78.0–100.0)
Platelets: 136 10*3/uL — ABNORMAL LOW (ref 150–400)
RBC: 4.48 MIL/uL (ref 4.22–5.81)
RDW: 13 % (ref 11.5–15.5)
WBC: 7.2 10*3/uL (ref 4.0–10.5)

## 2017-10-08 LAB — STREP PNEUMONIAE URINARY ANTIGEN: Strep Pneumo Urinary Antigen: NEGATIVE

## 2017-10-08 MED ORDER — LEVOFLOXACIN 750 MG PO TABS
750.0000 mg | ORAL_TABLET | Freq: Every day | ORAL | Status: AC
  Administered 2017-10-09 – 2017-10-11 (×3): 750 mg via ORAL
  Filled 2017-10-08 (×3): qty 1

## 2017-10-08 NOTE — Progress Notes (Signed)
PROGRESS NOTE    John Mccoy  XBJ:[id number] DOB: 09/20/1990 DOA: 10/07/2017 PCP: Patient, No Pcp Per   Brief Narrative: John Mccoy is a 27 y.o. male with medical history significant of seizure disorder. Patient presented secondary to shortness of breath and found to have bilateral pneumonia.   Assessment & Plan:   Active Problems:   Pneumonia   Respiratory failure with hypoxia (HCC)   Multifocal pneumonia Recent influenza B infection treated with Tamiflu. Likely current bacterial infection. No history of vaping per patient. Started on empiric vancomycin, Levaquin. Requiring significant amounts of oxygen, but is stable. MRSA pcr negative. -Continue oxygen -Pulmonology recommendations: started prednisone -Continue Levaquin, discontinue Vancomycin in setting of negative MRSA pcr.  Acute respiratory failure with hypoxia -Continue oxygen therapy as needed  Elevated LFTs Likely secondary recent influenza infection. Improving.  Seizure disorder -Continue Keppra and Depakote   DVT prophylaxis: Lovenox Code Status:   Code Status: Full Code Family Communication: None Disposition Plan: Discharge pending clinical improvement   Consultants:  Critical care medicine  Procedures:   None  Antimicrobials:  Vancomycin  Levaquin   Subjective: No issues.  Objective: Vitals:   10/08/17 0000 10/08/17 0400 10/08/17 0500 10/08/17 0800  BP: (!) 151/80  134/75   Pulse: 76  70   Resp: (!) 27  (!) 21   Temp:  99 F (37.2 C)  97.9 F (36.6 C)  TempSrc:  Oral  Oral  SpO2: 96%  90%   Weight:      Height:        Intake/Output Summary (Last 24 hours) at 10/08/2017 1045 Last data filed at 10/08/2017 0500 Gross per 24 hour  Intake 425.81 ml  Output 2250 ml  Net -1824.19 ml   Filed Weights   10/07/17 0800  Weight: 102.1 kg    Examination:  General exam: Appears calm and comfortable Respiratory system: Rhonchi. Respiratory effort  normal. Cardiovascular system: S1 & S2 heard, RRR. No murmurs, rubs, gallops or clicks. Gastrointestinal system: Abdomen is nondistended, soft and nontender. Normal bowel sounds heard. Central nervous system: Alert and oriented. No focal neurological deficits. Extremities: No edema. No calf tenderness Skin: No cyanosis. No rashes Psychiatry: Judgement and insight appear normal. Mood & affect appropriate.     Data Reviewed: I have personally reviewed following labs and imaging studies  CBC: Recent Labs  Lab 10/07/17 0802 10/08/17 0405  WBC 6.5 7.2  NEUTROABS 4.4  --   HGB 15.3 13.7  HCT 42.4 39.7  MCV 85.3 88.6  PLT 117* 136*   Basic Metabolic Panel: Recent Labs  Lab 10/07/17 0802 10/08/17 0405  NA 131* 138  K 3.1* 4.3  CL 89* 97*  CO2 27 30  GLUCOSE 124* 118*  BUN 12 9  CREATININE 0.94 0.68  CALCIUM 8.3* 8.5*   GFR: Estimated Creatinine Clearance: 175.8 mL/min (by C-G formula based on SCr of 0.68 mg/dL). Liver Function Tests: Recent Labs  Lab 10/07/17 0802 10/08/17 0405  AST 245* 174*  ALT 249* 208*  ALKPHOS 54 45  BILITOT 0.8 0.7  PROT 7.5 6.5  ALBUMIN 3.5 2.9*   No results for input(s): LIPASE, AMYLASE in the last 168 hours. No results for input(s): AMMONIA in the last 168 hours. Coagulation Profile: No results for input(s): INR, PROTIME in the last 168 hours. Cardiac Enzymes: No results for input(s): CKTOTAL, CKMB, CKMBINDEX, TROPONINI in the last 168 hours. BNP (last 3 results) No results for input(s): PROBNP in the last 8760 hours. HbA1C: No results for input(s):  HGBA1C in the last 72 hours. CBG: No results for input(s): GLUCAP in the last 168 hours. Lipid Profile: No results for input(s): CHOL, HDL, LDLCALC, TRIG, CHOLHDL, LDLDIRECT in the last 72 hours. Thyroid Function Tests: No results for input(s): TSH, T4TOTAL, FREET4, T3FREE, THYROIDAB in the last 72 hours. Anemia Panel: No results for input(s): VITAMINB12, FOLATE, FERRITIN, TIBC, IRON,  RETICCTPCT in the last 72 hours. Sepsis Labs: Recent Labs  Lab 10/07/17 0802  LATICACIDVEN 1.58    Recent Results (from the past 240 hour(s))  Blood Culture (routine x 2)     Status: None (Preliminary result)   Collection Time: 10/07/17  8:02 AM  Result Value Ref Range Status   Specimen Description   Final    BLOOD BLOOD RIGHT FOREARM Performed at [facility], [address] [place], [place] [address]    Special Requests   Final    BOTTLES DRAWN AEROBIC AND ANAEROBIC Blood Culture adequate volume Performed at [facility], [address] [place], [place] [address]    Culture   Final    NO GROWTH < 24 HOURS Performed at [facility], [address], [place], [place] [address]    Report Status PENDING  Incomplete  Blood Culture (routine x 2)     Status: None (Preliminary result)   Collection Time: 10/07/17  8:17 AM  Result Value Ref Range Status   Specimen Description   Final    BLOOD LEFT ANTECUBITAL Performed at [facility], [address] [place], [place] [address]    Special Requests   Final    BOTTLES DRAWN AEROBIC AND ANAEROBIC Blood Culture adequate volume Performed at [facility], [address] [place], [place] [address]    Culture   Final    NO GROWTH < 24 HOURS Performed at [facility], [address], [place], [place] [address]    Report Status PENDING  Incomplete  Culture, sputum-assessment     Status: None   Collection Time: 10/07/17  3:46 PM  Result Value Ref Range Status   Specimen Description SPUTUM  Final   Special Requests NONE  Final   Sputum evaluation   Final    THIS SPECIMEN IS ACCEPTABLE FOR SPUTUM CULTURE Performed at [facility], [address] [place], [place] [address]    Report Status 10/07/2017 FINAL  Final  Culture, respiratory     Status: None (Preliminary result)   Collection Time: 10/07/17  3:46 PM  Result Value Ref Range Status   Specimen  Description   Final    SPUTUM Performed at [facility], [address] [place], [place] [address]    Special Requests   Final    NONE Reflexed from [id number] Performed at [facility], [address] [place], [place] [address]    Gram Stain   Final    MODERATE WBC PRESENT,BOTH PMN AND MONONUCLEAR FEW GRAM POSITIVE RODS FEW GRAM POSITIVE COCCI Performed at [facility], [address] [place], [place] [address]    Culture PENDING  Incomplete   Report Status PENDING  Incomplete  MRSA PCR Screening     Status: None   Collection Time: 10/07/17  3:47 PM  Result Value Ref Range Status   MRSA by PCR NEGATIVE NEGATIVE Final    Comment:        The GeneXpert MRSA Assay (FDA approved for NASAL specimens only),  is one component of a comprehensive MRSA colonization surveillance program. It is not intended to diagnose MRSA infection nor to guide or monitor treatment for MRSA infections. Performed at [facility], [address] [place], [place] [address]          Radiology Studies: Dg Chest 2 View  Result Date: 10/07/2017 CLINICAL DATA:  Flu. Fever, increased work of breathing, shortness of breath, hypoxia. EXAM: CHEST - 2 VIEW COMPARISON:  None. FINDINGS: Multifocal patchy opacities in the lingula and bilateral lower lobes, suspicious for pneumonia. No pleural effusion or pneumothorax. The heart is normal in size. Visualized osseous structures are within normal limits. IMPRESSION: Multifocal pneumonia, left lung predominant. Electronically Signed   By: Charline Bills M.D.   On: 10/07/2017 08:40   Dg Chest Port 1 View  Result Date: 10/07/2017 CLINICAL DATA:  Flu.  Fever, hypoxia, shortness of breath EXAM: PORTABLE CHEST 1 VIEW COMPARISON:  10/07/2017 FINDINGS: Patchy bilateral airspace opacities are again noted, unchanged from earlier today. Heart is normal size. No effusions or pneumothorax. No acute bony abnormality.  IMPRESSION: Patchy bilateral airspace disease compatible with multifocal pneumonia, stable. Electronically Signed   By: Charlett Nose M.D.   On: 10/07/2017 16:19        Scheduled Meds: . divalproex  1,000 mg Oral BID AC  . divalproex  1,500 mg Oral QHS  . enoxaparin (LOVENOX) injection  40 mg Subcutaneous Q24H  . levETIRAcetam  1,000 mg Oral 2 times per day  . levETIRAcetam  1,500 mg Oral QAC breakfast  . [START ON 10/09/2017] levofloxacin  750 mg Oral Daily  . mouth rinse  15 mL Mouth Rinse BID  . predniSONE  20 mg Oral BID WC   Continuous Infusions: . sodium chloride    . sodium chloride 75 mL/hr at 10/07/17 2128     LOS: 1 day     Jacquelin Hawking, MD [facility] 10/08/2017, 10:45 AM Pager: [phone]  If 7PM-7AM, please contact night-coverage [email or web address] 10/08/2017, 10:45 AM

## 2017-10-08 NOTE — Progress Notes (Signed)
NAME:  John Mccoy, MRN:  [id number], DOB:  [birth year], LOS: 1 ADMISSION DATE:  10/07/2017, CONSULTATION DATE: 10/07/2017 REFERRING MD: Dr. Elvera Lennox, CHIEF COMPLAINT: Shortness of breath  Brief History   Patient was admitted with worsening shortness of breath Was recently treated for influenza B--completed a course of Tamiflu Ongoing fevers chills led to him presenting to the emergency department Was noted to be tachycardic and tachypneic to the ICU, still requiring high flow oxygen  Significant Hospital Events   Continue to deteriorate in the emergency department requiring nonrebreather mask He had initially started on nasal cannula Hemodynamics have been stable, respiratory status has been stable as well  Consults: date of consult/date signed off & final recs:   Procedures (surgical and bedside):  None Significant Diagnostic Tests:  ABG noted for significant hypoxemia Chest x-ray reviewed showing multifocal basal infiltrates   Micro Data: Blood cultures negative to date, 10/07/2017  Antimicrobials:  Vancomycin 10/07/2017>> Levaquin 10/07/2017>>  Subjective:  Continues to feel fatigued Nausea is better Feels relatively well otherwise Denies any chest pains or chest discomfort  Objective   Blood pressure 134/75, pulse 70, temperature 97.9 F (36.6 C), temperature source Oral, resp. rate (!) 21, height 6\' 1"  (1.854 m), weight 102.1 kg, SpO2 90 %.    FiO2 (%):  [50 %-100 %] 100 %   Intake/Output Summary (Last 24 hours) at 10/08/2017 1023 Last data filed at 10/08/2017 0500 Gross per 24 hour  Intake 425.81 ml  Output 2250 ml  Net -1824.19 ml   Filed Weights   10/07/17 0800  Weight: 102.1 kg    Examination: General: Young gentleman, does appear comfortable HENT: Moist oral mucosa, no oral lesions, neck supple Lungs: Good air entry bilaterally, rales at the bases Cardiovascular: S1-S2 appreciated Abdomen: Bowel sounds appreciated, soft nontender  Extremities:  No edema no clubbing Neuro: Alert oriented x3, moving all extremities GU: Good output  Resolved Hospital Problem list    Assessment & Plan:   Hypoxemic respiratory failure Recent influenza infection Possible bacterial infection Extensive infiltration  Multilobar pneumonia We will continue antibiotics at the present time-on vancomycin and Levaquin I do believe we can discontinue vancomycin today  Seizure disorder Stable status  Hypoxemia/pneumonitis Stable Continue steroids Wean oxygen supplementation as tolerated   Disposition / Summary of Today's Plan 10/08/17   Continue to wean oxygen supplementation Continue steroids Leave on Levaquin    Diet: As tolerated Pain/Anxiety/Delirium protocol (if indicated): Not indicated VAP protocol (if indicated) not indicated DVT prophylaxis: On Lovenox  GI prophylaxis: Not indicated Hyperglycemia protocol: Not indicated Mobility: Ambulate as tolerated Code Status: Full code Family Communication: We will update  Labs   CBC: Recent Labs  Lab 10/07/17 0802 10/08/17 0405  WBC 6.5 7.2  NEUTROABS 4.4  --   HGB 15.3 13.7  HCT 42.4 39.7  MCV 85.3 88.6  PLT 117* 136*   Basic Metabolic Panel: Recent Labs  Lab 10/07/17 0802 10/08/17 0405  NA 131* 138  K 3.1* 4.3  CL 89* 97*  CO2 27 30  GLUCOSE 124* 118*  BUN 12 9  CREATININE 0.94 0.68  CALCIUM 8.3* 8.5*   GFR: Estimated Creatinine Clearance: 175.8 mL/min (by C-G formula based on SCr of 0.68 mg/dL). Recent Labs  Lab 10/07/17 0802 10/08/17 0405  WBC 6.5 7.2  LATICACIDVEN 1.58  --    Liver Function Tests: Recent Labs  Lab 10/07/17 0802 10/08/17 0405  AST 245* 174*  ALT 249* 208*  ALKPHOS 54 45  BILITOT 0.8 0.7  PROT 7.5 6.5  ALBUMIN 3.5 2.9*   No results for input(s): LIPASE, AMYLASE in the last 168 hours. No results for input(s): AMMONIA in the last 168 hours. ABG    Component Value Date/Time   PHART 7.465 (H) 10/07/2017 1601   PCO2ART 44.1  10/07/2017 1601   PO2ART 63.6 (L) 10/07/2017 1601   HCO3 31.3 (H) 10/07/2017 1601   O2SAT 92.3 10/07/2017 1601    Coagulation Profile: No results for input(s): INR, PROTIME in the last 168 hours. Cardiac Enzymes: No results for input(s): CKTOTAL, CKMB, CKMBINDEX, TROPONINI in the last 168 hours. HbA1C: No results found for: HGBA1C CBG: No results for input(s): GLUCAP in the last 168 hours.  Admitting History of Present Illness.   Recent influenza infection Hypoxemic respiratory failure Review of Systems:   Review of Systems  Constitutional: Negative for fever.  Eyes: Negative.   Respiratory: Negative for sputum production.   Cardiovascular: Negative.   Musculoskeletal: Negative for myalgias.    Past medical history  He,  has a past medical history of Seizures (HCC).   Surgical History   History reviewed. No pertinent surgical history.   Social History   Social History   Socioeconomic History  . Marital status: Single    Spouse name: Not on file  . Number of children: Not on file  . Years of education: Not on file  . Highest education level: Not on file  Occupational History  . Not on file  Social Needs  . Financial resource strain: Not on file  . Food insecurity:    Worry: Not on file    Inability: Not on file  . Transportation needs:    Medical: Not on file    Non-medical: Not on file  Tobacco Use  . Smoking status: Never Smoker  . Smokeless tobacco: Never Used  Substance and Sexual Activity  . Alcohol use: Not Currently  . Drug use: Not Currently  . Sexual activity: Not on file  Lifestyle  . Physical activity:    Days per week: Not on file    Minutes per session: Not on file  . Stress: Not on file  Relationships  . Social connections:    Talks on phone: Not on file    Gets together: Not on file    Attends religious service: Not on file    Active member of club or organization: Not on file    Attends meetings of clubs or organizations: Not on  file    Relationship status: Not on file  . Intimate partner violence:    Fear of current or ex partner: Not on file    Emotionally abused: Not on file    Physically abused: Not on file    Forced sexual activity: Not on file  Other Topics Concern  . Not on file  Social History Narrative  . Not on file  ,  reports that he has never smoked. He has never used smokeless tobacco. He reports that he drank alcohol. He reports that he has current or past drug history.   Family history   His family history is not on file.   Allergies Allergies  Allergen Reactions  . Other Swelling    Eyes, nose & mouth swell shut NSAIDS  . Daucus Carota Itching and Swelling    Also Cantalope; Celery  . Azithromycin Rash    Home meds  Prior to Admission medications   Medication Sig Start Date End Date Taking? Authorizing Provider  divalproex (DEPAKOTE ER)  500 MG 24 hr tablet Take 1,000-1,500 mg by mouth 3 (three) times daily. Per Pharmacy 1000mg  QAM, 1000mg  midday, 1500mg  QPM Per Pt 1500mg  QAM, 1000mg  midday, 1500mg  QPM 02/23/17  Yes [provider]  levETIRAcetam (KEPPRA) 1000 MG tablet Take 1,000-1,500 mg by mouth 3 (three) times daily. 1500mg  QAM & 1000mg  Q afternoon & 1000mg  QPM 07/19/17  Yes [provider]  oseltamivir (TAMIFLU) 75 MG capsule Take 75 mg by mouth 2 (two) times daily.  09/30/17   [provider]

## 2017-10-09 ENCOUNTER — Inpatient Hospital Stay (HOSPITAL_COMMUNITY): Payer: Self-pay

## 2017-10-09 LAB — CBC
HEMATOCRIT: 40.7 % (ref 39.0–52.0)
HEMOGLOBIN: 13.7 g/dL (ref 13.0–17.0)
MCH: 30.6 pg (ref 26.0–34.0)
MCHC: 33.7 g/dL (ref 30.0–36.0)
MCV: 90.8 fL (ref 78.0–100.0)
Platelets: 199 10*3/uL (ref 150–400)
RBC: 4.48 MIL/uL (ref 4.22–5.81)
RDW: 13.6 % (ref 11.5–15.5)
WBC: 7.1 10*3/uL (ref 4.0–10.5)

## 2017-10-09 LAB — RESPIRATORY PANEL BY PCR
Adenovirus: NOT DETECTED
BORDETELLA PERTUSSIS-RVPCR: NOT DETECTED
CHLAMYDOPHILA PNEUMONIAE-RVPPCR: NOT DETECTED
CORONAVIRUS HKU1-RVPPCR: NOT DETECTED
Coronavirus 229E: NOT DETECTED
Coronavirus NL63: NOT DETECTED
Coronavirus OC43: NOT DETECTED
Influenza A: NOT DETECTED
Influenza B: NOT DETECTED
Metapneumovirus: NOT DETECTED
Mycoplasma pneumoniae: NOT DETECTED
PARAINFLUENZA VIRUS 4-RVPPCR: NOT DETECTED
Parainfluenza Virus 1: NOT DETECTED
Parainfluenza Virus 2: NOT DETECTED
Parainfluenza Virus 3: NOT DETECTED
RESPIRATORY SYNCYTIAL VIRUS-RVPPCR: NOT DETECTED
RHINOVIRUS / ENTEROVIRUS - RVPPCR: NOT DETECTED

## 2017-10-09 LAB — COMPREHENSIVE METABOLIC PANEL
ALBUMIN: 2.9 g/dL — AB (ref 3.5–5.0)
ALT: 147 U/L — ABNORMAL HIGH (ref 0–44)
ANION GAP: 9 (ref 5–15)
AST: 95 U/L — ABNORMAL HIGH (ref 15–41)
Alkaline Phosphatase: 38 U/L (ref 38–126)
BUN: 9 mg/dL (ref 6–20)
CALCIUM: 8.6 mg/dL — AB (ref 8.9–10.3)
CO2: 33 mmol/L — AB (ref 22–32)
Chloride: 100 mmol/L (ref 98–111)
Creatinine, Ser: 0.63 mg/dL (ref 0.61–1.24)
GFR calc Af Amer: >60 mL/min (ref >60)
GFR calc non Af Amer: >60 mL/min (ref >60)
GLUCOSE: 97 mg/dL (ref 70–99)
POTASSIUM: 3.9 mmol/L (ref 3.5–5.1)
SODIUM: 142 mmol/L (ref 135–145)
Total Bilirubin: 0.6 mg/dL (ref 0.3–1.2)
Total Protein: 6.3 g/dL — ABNORMAL LOW (ref 6.5–8.1)

## 2017-10-09 LAB — LEGIONELLA PNEUMOPHILA SEROGP 1 UR AG: L. pneumophila Serogp 1 Ur Ag: NEGATIVE

## 2017-10-09 LAB — HIV ANTIBODY (ROUTINE TESTING W REFLEX): HIV Screen 4th Generation wRfx: NONREACTIVE

## 2017-10-09 MED ORDER — POTASSIUM CHLORIDE CRYS ER 20 MEQ PO TBCR
20.0000 meq | EXTENDED_RELEASE_TABLET | Freq: Once | ORAL | Status: AC
  Administered 2017-10-09: 20 meq via ORAL
  Filled 2017-10-09: qty 1

## 2017-10-09 MED ORDER — FUROSEMIDE 10 MG/ML IJ SOLN
20.0000 mg | Freq: Once | INTRAMUSCULAR | Status: AC
  Administered 2017-10-09: 20 mg via INTRAVENOUS
  Filled 2017-10-09: qty 2

## 2017-10-09 NOTE — Care Management Note (Signed)
Case Management Note  Patient Details  Name: John HavensKristopher Mccoy MRN: [id number] Date of Birth: 01/31/1990  Subjective/Objective:                  pna  Action/Plan: Following for progression of care. Following for cm needs, none present at this time, no discharge plans at this time.  Expected Discharge Date:                  Expected Discharge Plan:  Home/Self Care  In-House Referral:     Discharge planning Services  CM Consult  Post Acute Care Choice:    Choice offered to:     DME Arranged:    DME Agency:     HH Arranged:    HH Agency:     Status of Service:  In process, will continue to follow  If discussed at Long Length of Stay Meetings, dates discussed:    Additional Comments:  Golda AcreDavis, Rhonda Lynn, RN 10/09/2017, 9:11 AM

## 2017-10-09 NOTE — Progress Notes (Signed)
PROGRESS NOTE    John Mccoy  WJX:[id number] DOB: 06/01/1990 DOA: 10/07/2017 PCP: Patient, No Pcp Per   Brief Narrative: John Mccoy is a 27 y.o. male with medical history significant of seizure disorder. Patient presented secondary to shortness of breath and found to have multifocal pneumonia.   Assessment & Plan:   Active Problems:   Pneumonia   Respiratory failure with hypoxia (HCC)   Multifocal pneumonia Recent influenza B infection treated with Tamiflu. Likely current bacterial infection. No history of vaping per patient. Started on empiric vancomycin, Levaquin. Requiring significant amounts of oxygen, but is stable. MRSA pcr negative. Vancomycin discontinued -Continue oxygen, wean to room air -Pulmonology recommendations: antibiotics, prednisone -Continue Levaquin  Acute respiratory failure with hypoxia Still on 10 L via HFNC. Weaning as able -Continue oxygen therapy as needed. Wean to room air.  Elevated LFTs Likely secondary recent influenza infection. Improving.  Thrombocytopenia In setting of acute infection. Mild. -CBC  Seizure disorder -Continue Keppra and Depakote   DVT prophylaxis: Lovenox Code Status:   Code Status: Full Code Family Communication: None at bedside Disposition Plan: Discharge pending clinical improvement   Consultants:  Critical care medicine  Procedures:   None  Antimicrobials:  Vancomycin  Levaquin   Subjective: Still with some coughing. No chest pain. Feeling better  Objective: Vitals:   10/09/17 0000 10/09/17 0013 10/09/17 0327 10/09/17 0400  BP: 123/66   (!) 112/52  Pulse: (!) 57   (!) 46  Resp: 19   15  Temp:   97.8 F (36.6 C)   TempSrc:   Oral   SpO2: 91% 96%  96%  Weight:      Height:        Intake/Output Summary (Last 24 hours) at 10/09/2017 0747 Last data filed at 10/09/2017 0500 Gross per 24 hour  Intake 1782.5 ml  Output 2300 ml  Net -517.5 ml   Filed Weights   10/07/17 0800    Weight: 102.1 kg    Examination:  General exam: Appears calm and comfortable Respiratory system: Rales with rhonchi on inspiration. Respiratory effort normal. Cardiovascular system: S1 & S2 heard, RRR. No murmurs, rubs, gallops or clicks. Gastrointestinal system: Abdomen is nondistended, soft and nontender. No organomegaly or masses felt. Normal bowel sounds heard. Central nervous system: Alert and oriented. No focal neurological deficits. Extremities: No edema. No calf tenderness Skin: No cyanosis. No rashes Psychiatry: Judgement and insight appear normal. Mood & affect appropriate.     Data Reviewed: I have personally reviewed following labs and imaging studies  CBC: Recent Labs  Lab 10/07/17 0802 10/08/17 0405  WBC 6.5 7.2  NEUTROABS 4.4  --   HGB 15.3 13.7  HCT 42.4 39.7  MCV 85.3 88.6  PLT 117* 136*   Basic Metabolic Panel: Recent Labs  Lab 10/07/17 0802 10/08/17 0405  NA 131* 138  K 3.1* 4.3  CL 89* 97*  CO2 27 30  GLUCOSE 124* 118*  BUN 12 9  CREATININE 0.94 0.68  CALCIUM 8.3* 8.5*   GFR: Estimated Creatinine Clearance: 175.8 mL/min (by C-G formula based on SCr of 0.68 mg/dL). Liver Function Tests: Recent Labs  Lab 10/07/17 0802 10/08/17 0405  AST 245* 174*  ALT 249* 208*  ALKPHOS 54 45  BILITOT 0.8 0.7  PROT 7.5 6.5  ALBUMIN 3.5 2.9*   No results for input(s): LIPASE, AMYLASE in the last 168 hours. No results for input(s): AMMONIA in the last 168 hours. Coagulation Profile: No results for input(s): INR, PROTIME in the last  168 hours. Cardiac Enzymes: No results for input(s): CKTOTAL, CKMB, CKMBINDEX, TROPONINI in the last 168 hours. BNP (last 3 results) No results for input(s): PROBNP in the last 8760 hours. HbA1C: No results for input(s): HGBA1C in the last 72 hours. CBG: No results for input(s): GLUCAP in the last 168 hours. Lipid Profile: No results for input(s): CHOL, HDL, LDLCALC, TRIG, CHOLHDL, LDLDIRECT in the last 72  hours. Thyroid Function Tests: No results for input(s): TSH, T4TOTAL, FREET4, T3FREE, THYROIDAB in the last 72 hours. Anemia Panel: No results for input(s): VITAMINB12, FOLATE, FERRITIN, TIBC, IRON, RETICCTPCT in the last 72 hours. Sepsis Labs: Recent Labs  Lab 10/07/17 0802  LATICACIDVEN 1.58    Recent Results (from the past 240 hour(s))  Blood Culture (routine x 2)     Status: None (Preliminary result)   Collection Time: 10/07/17  8:02 AM  Result Value Ref Range Status   Specimen Description   Final    BLOOD BLOOD RIGHT FOREARM Performed at [facility], [address] [place], [place] [address]    Special Requests   Final    BOTTLES DRAWN AEROBIC AND ANAEROBIC Blood Culture adequate volume Performed at [facility], [address] [place], [place] [address]    Culture   Final    NO GROWTH 2 DAYS Performed at [facility], [address] [place], [place] [address]    Report Status PENDING  Incomplete  Blood Culture (routine x 2)     Status: None (Preliminary result)   Collection Time: 10/07/17  8:17 AM  Result Value Ref Range Status   Specimen Description   Final    BLOOD LEFT ANTECUBITAL Performed at [facility], [address] [place], [place] [address]    Special Requests   Final    BOTTLES DRAWN AEROBIC AND ANAEROBIC Blood Culture adequate volume Performed at [facility], [address] [place], [place] [address]    Culture   Final    NO GROWTH 2 DAYS Performed at [facility], [address], [place], [place] [address]    Report Status PENDING  Incomplete  Culture, sputum-assessment     Status: None   Collection Time: 10/07/17  3:46 PM  Result Value Ref Range Status   Specimen Description SPUTUM  Final   Special Requests NONE  Final   Sputum evaluation   Final    THIS SPECIMEN IS ACCEPTABLE FOR SPUTUM CULTURE Performed at [facility], [address] [place], [place]  [address]    Report Status 10/07/2017 FINAL  Final  Culture, respiratory     Status: None (Preliminary result)   Collection Time: 10/07/17  3:46 PM  Result Value Ref Range Status   Specimen Description   Final    SPUTUM Performed at [facility], [address] [place], [place] [address]    Special Requests   Final    NONE Reflexed from [id number] Performed at [facility], [address] [place], [place] [address]    Gram Stain   Final    MODERATE WBC PRESENT,BOTH PMN AND MONONUCLEAR FEW GRAM POSITIVE RODS FEW GRAM POSITIVE COCCI    Culture   Final    TOO YOUNG TO READ Performed at [facility], [address] [place], [place] [address]    Report Status PENDING  Incomplete  MRSA PCR Screening     Status: None  Collection Time: 10/07/17  3:47 PM  Result Value Ref Range Status   MRSA by PCR NEGATIVE NEGATIVE Final    Comment:        The GeneXpert MRSA Assay (FDA approved for NASAL specimens only), is one component of a comprehensive MRSA colonization surveillance program. It is not intended to diagnose MRSA infection nor to guide or monitor treatment for MRSA infections. Performed at [facility], [address], [place], [place] [address]          Radiology Studies: Dg Chest 2 View  Result Date: 10/07/2017 CLINICAL DATA:  Flu. Fever, increased work of breathing, shortness of breath, hypoxia. EXAM: CHEST - 2 VIEW COMPARISON:  None. FINDINGS: Multifocal patchy opacities in the lingula and bilateral lower lobes, suspicious for pneumonia. No pleural effusion or pneumothorax. The heart is normal in size. Visualized osseous structures are within normal limits. IMPRESSION: Multifocal pneumonia, left lung predominant. Electronically Signed   By: Charline Bills M.D.   On: 10/07/2017 08:40   Dg Chest Port 1 View  Result Date: 10/07/2017 CLINICAL DATA:  Flu.  Fever, hypoxia, shortness of breath EXAM: PORTABLE  CHEST 1 VIEW COMPARISON:  10/07/2017 FINDINGS: Patchy bilateral airspace opacities are again noted, unchanged from earlier today. Heart is normal size. No effusions or pneumothorax. No acute bony abnormality. IMPRESSION: Patchy bilateral airspace disease compatible with multifocal pneumonia, stable. Electronically Signed   By: Charlett Nose M.D.   On: 10/07/2017 16:19        Scheduled Meds: . divalproex  1,000 mg Oral BID AC  . divalproex  1,500 mg Oral QHS  . enoxaparin (LOVENOX) injection  40 mg Subcutaneous Q24H  . levETIRAcetam  1,000 mg Oral 2 times per day  . levETIRAcetam  1,500 mg Oral QAC breakfast  . levofloxacin  750 mg Oral Daily  . mouth rinse  15 mL Mouth Rinse BID  . predniSONE  20 mg Oral BID WC   Continuous Infusions: . sodium chloride    . sodium chloride 75 mL/hr at 10/09/17 0431     LOS: 2 days     Jacquelin Hawking, MD [facility] 10/09/2017, 7:47 AM Pager: [phone]  If 7PM-7AM, please contact night-coverage [email or web address] 10/09/2017, 7:47 AM

## 2017-10-09 NOTE — Progress Notes (Signed)
NAME:  John Mccoy, MRN:  [id number], DOB:  1990/11/20, LOS: 2 ADMISSION DATE:  10/07/2017, CONSULTATION DATE: 10/07/2017 REFERRING MD: Dr. Elvera Lennox, CHIEF COMPLAINT: Shortness of breath  Brief History   Patient was admitted with worsening shortness of breath Was recently treated for influenza B--completed a course of Tamiflu Ongoing fevers chills led to him presenting to the emergency department Was noted to be tachycardic and tachypneic to the ICU, still requiring high flow oxygen  Significant Hospital Events   Continue to deteriorate in the emergency department requiring nonrebreather mask He had initially started on nasal cannula 9/22: Hemodynamics have been stable, respiratory status has been stable as well 9/23: Feeling better, oxygen requirements improving  Consults: date of consult/date signed off & final recs:   Procedures (surgical and bedside):  None Significant Diagnostic Tests:  ABG noted for significant hypoxemia Chest x-ray reviewed showing multifocal basal infiltrates   Micro Data: Blood cultures negative to date, 10/07/2017  Antimicrobials:  Vancomycin 10/07/2017>> 9/22 Levaquin 10/07/2017>>  Subjective:  Feels better, shortness of breath is improved, has some chest pain with cough  Objective   Blood pressure (Abnormal) 113/45, pulse 60, temperature 97.7 F (36.5 C), temperature source Oral, resp. rate 18, height 6\' 1"  (1.854 m), weight 102.1 kg, SpO2 98 %.        Intake/Output Summary (Last 24 hours) at 10/09/2017 0920 Last data filed at 10/09/2017 0500 Gross per 24 hour  Intake 1670 ml  Output 2300 ml  Net -630 ml   Filed Weights   10/07/17 0800  Weight: 102.1 kg    Examination: General:'s a well-nourished 27 year old white male currently resting in bed he is in no acute distress HEENT normocephalic atraumatic no jugular venous distention Pulmonary: Diffuse rales and rhonchi, no accessory use Cardiac: Regular rate and rhythm Abdomen: Soft  nontender no organomegaly Extremities: Warm and dry no edema strong pulses GU: Voids spontaneously Neuro: Awake oriented no focal deficits Resolved Hospital Problem list    Assessment & Plan:   Hypoxemic respiratory failure setting of multilobar pneumonia/CAP (no organism specified)  -Presumed bacterial superinfection following influenza B -Suspect element of ARDS Clinically improving Still on 9 L high flow Plan Continuing Levaquin, day number 3 Wean oxygen for saturations greater than 92% Lasix x1 today Complete 5 days prednisone Incentive spirometry and mobilize Follow-up chest x-ray today  Seizure disorder Plan Continue AEDs   Disposition / Summary of Today's Plan 10/09/17   Clinically improved.  Will check chest x-ray, give one-time dose of Lasix, continue current management otherwise.    Diet: As tolerated Pain/Anxiety/Delirium protocol (if indicated): Not indicated VAP protocol (if indicated) not indicated DVT prophylaxis: On Lovenox  GI prophylaxis: Not indicated Hyperglycemia protocol: Not indicated Mobility: Ambulate as tolerated Code Status: Full code Family Communication: We will update  Labs   CBC: Recent Labs  Lab 10/07/17 0802 10/08/17 0405 10/09/17 0813  WBC 6.5 7.2 7.1  NEUTROABS 4.4  --   --   HGB 15.3 13.7 13.7  HCT 42.4 39.7 40.7  MCV 85.3 88.6 90.8  PLT 117* 136* 199   Basic Metabolic Panel: Recent Labs  Lab 10/07/17 0802 10/08/17 0405 10/09/17 0813  NA 131* 138 142  K 3.1* 4.3 3.9  CL 89* 97* 100  CO2 27 30 33*  GLUCOSE 124* 118* 97  BUN 12 9 9   CREATININE 0.94 0.68 0.63  CALCIUM 8.3* 8.5* 8.6*   GFR: Estimated Creatinine Clearance: 175.8 mL/min (by C-G formula based on SCr of 0.63 mg/dL). Recent Labs  Lab 10/07/17 0802 10/08/17 0405 10/09/17 0813  WBC 6.5 7.2 7.1  LATICACIDVEN 1.58  --   --    Liver Function Tests: Recent Labs  Lab 10/07/17 0802 10/08/17 0405 10/09/17 0813  AST 245* 174* 95*  ALT 249* 208*  147*  ALKPHOS 54 45 38  BILITOT 0.8 0.7 0.6  PROT 7.5 6.5 6.3*  ALBUMIN 3.5 2.9* 2.9*   No results for input(s): LIPASE, AMYLASE in the last 168 hours. No results for input(s): AMMONIA in the last 168 hours. ABG    Component Value Date/Time   PHART 7.465 (H) 10/07/2017 1601   PCO2ART 44.1 10/07/2017 1601   PO2ART 63.6 (L) 10/07/2017 1601   HCO3 31.3 (H) 10/07/2017 1601   O2SAT 92.3 10/07/2017 1601    Coagulation Profile: No results for input(s): INR, PROTIME in the last 168 hours. Cardiac Enzymes: No results for input(s): CKTOTAL, CKMB, CKMBINDEX, TROPONINI in the last 168 hours. HbA1C: No results found for: HGBA1C CBG: No results for input(s): GLUCAP in the last 168 hours.  Simonne MartinetPeter E Rylyn Ranganathan ACNP-BC [facility] Pulmonary/Critical Care Pager # [phone] OR # [phone] if no answer

## 2017-10-10 LAB — CULTURE, RESPIRATORY W GRAM STAIN

## 2017-10-10 LAB — CULTURE, RESPIRATORY: CULTURE: NORMAL

## 2017-10-10 LAB — BASIC METABOLIC PANEL
Anion gap: 12 (ref 5–15)
BUN: 12 mg/dL (ref 6–20)
CALCIUM: 9.1 mg/dL (ref 8.9–10.3)
CO2: 34 mmol/L — AB (ref 22–32)
CREATININE: 0.67 mg/dL (ref 0.61–1.24)
Chloride: 99 mmol/L (ref 98–111)
GFR calc Af Amer: >60 mL/min (ref >60)
GFR calc non Af Amer: >60 mL/min (ref >60)
GLUCOSE: 88 mg/dL (ref 70–99)
Potassium: 3.9 mmol/L (ref 3.5–5.1)
Sodium: 145 mmol/L (ref 135–145)

## 2017-10-10 MED ORDER — FUROSEMIDE 10 MG/ML IJ SOLN
20.0000 mg | Freq: Once | INTRAMUSCULAR | Status: AC
  Administered 2017-10-10: 20 mg via INTRAVENOUS
  Filled 2017-10-10: qty 2

## 2017-10-10 MED ORDER — POTASSIUM CHLORIDE CRYS ER 20 MEQ PO TBCR
20.0000 meq | EXTENDED_RELEASE_TABLET | Freq: Once | ORAL | Status: AC
  Administered 2017-10-10: 20 meq via ORAL
  Filled 2017-10-10: qty 1

## 2017-10-10 NOTE — Progress Notes (Signed)
Pharmacy Antibiotic Note  John Mccoy is a 27 y.o. male with flu last week admitted on 10/07/2017 with acute respiratory failure and PNA.  Pharmacy has been consulted for Levaquin dosing.  Plan: Continue Levaquin 750 mg oral daily.   Height: 6\' 1"  (185.4 cm) Weight: 225 lb (102.1 kg) IBW/kg (Calculated) : 79.9  Temp (24hrs), Avg:97.9 F (36.6 C), Min:97.7 F (36.5 C), Max:98 F (36.7 C)  Recent Labs  Lab 10/07/17 0802 10/08/17 0405 10/09/17 0813 10/10/17 0928  WBC 6.5 7.2 7.1  --   CREATININE 0.94 0.68 0.63 0.67  LATICACIDVEN 1.58  --   --   --     Estimated Creatinine Clearance: 175.8 mL/min (by C-G formula based on SCr of 0.67 mg/dL).    Allergies  Allergen Reactions  . Other Swelling    Eyes, nose & mouth swell shut NSAIDS  . Daucus Carota Itching and Swelling    Also Cantalope; Celery  . Azithromycin Rash    Antimicrobials this admission: 9/21 vanc>> 9/22 9/21 levaquin>>  Dose adjustments this admission:  Microbiology results: 9/21 BCx: NGTD 9/21 sputum: normal flora MRSA PCR neg  Thank you for allowing pharmacy to be a part of this patient's care. No further dosing adjustments necessary. Pharmacy will sign off now. Please re-consult if needed.   John Mccoy, John Mccoy 10/10/2017 1:46 PM

## 2017-10-10 NOTE — Progress Notes (Signed)
NAME:  John Mccoy, MRN:  [id number], DOB:  01-02-1991, LOS: 3 ADMISSION DATE:  10/07/2017, CONSULTATION DATE: 10/07/2017 REFERRING MD: Dr. Elvera Lennox, CHIEF COMPLAINT: Shortness of breath  Brief History   Patient was admitted with worsening shortness of breath Was recently treated for influenza B--completed a course of Tamiflu Ongoing fevers chills led to him presenting to the emergency department Was noted to be tachycardic and tachypneic to the ICU, still requiring high flow oxygen  Significant Hospital Events   Continue to deteriorate in the emergency department requiring nonrebreather mask He had initially started on nasal cannula 9/22: Hemodynamics have been stable, respiratory status has been stable as well 9/23: Feeling better, oxygen requirements improving, got 1 dose of Lasix 9/24: -4.2 L, oxygen requirements down from 9 L to 4 L feeling better  Consults: date of consult/date signed off & final recs:   Procedures (surgical and bedside):  None Significant Diagnostic Tests:  ABG noted for significant hypoxemia Chest x-ray reviewed showing multifocal basal infiltrates   Micro Data: Blood cultures negative to date, 10/07/2017  Antimicrobials:  Vancomycin 10/07/2017>> 9/22 Levaquin 10/07/2017>>  Subjective:  Feels better, shortness of breath is improved, has some chest pain with cough  Objective   Blood pressure 105/72, pulse 65, temperature 98 F (36.7 C), temperature source Oral, resp. rate 18, height 6\' 1"  (1.854 m), weight 102.1 kg, SpO2 93 %.        Intake/Output Summary (Last 24 hours) at 10/10/2017 0907 Last data filed at 10/10/2017 0700 Gross per 24 hour  Intake 120 ml  Output 3125 ml  Net -3005 ml   Filed Weights   10/07/17 0800  Weight: 102.1 kg    Examination: General: This is a well-nourished 27 year old white male is currently resting in bed and in no acute distress HEENT normocephalic atraumatic no jugular venous distention mucous membranes  are moist Pulmonary: Basilar rales left greater than right, improved breath sounds in comparison to day prior Cardiac: Regular rate and rhythm Abdomen: Soft nontender Extremities: Warm dry brisk capillary refill strong pulses Neuro: Awake oriented no focal deficits GU: Clear yellow urine  Resolved Hospital Problem list    Assessment & Plan:   Hypoxemic respiratory failure setting of multilobar pneumonia/CAP (no organism specified)  -Presumed bacterial superinfection following influenza B -Suspect element of ARDS Clinically improving Portable chest x-ray personally reviewed: Shows improving aeration bilaterally still with left greater than right airspace disease Plan Continuing Levaquin day #4, reasonable to complete 8-day course Continue to wean oxygen Complete 5-day total prednisone Repeat Lasix x1 Chest x-ray today Mobilize Incentive spirometry Wean oxygen.  Suspect he will be ready for discharge in the next 24 to 48 hours  Seizure disorder Plan Continue AEDs   Disposition / Summary of Today's Plan 10/10/17   Continues to improve.  I will get a chest x-ray today, I anticipate this will again be improved.  Give 1 more day of Lasix.  Continue to push mobilization, assess oxygen requirements.  Likely ready for discharge in the next 24 to 48 hours critical care will sign off.  He will need outpatient follow-up with his PCP for chest x-ray evaluation    Diet: As tolerated Pain/Anxiety/Delirium protocol (if indicated): Not indicated VAP protocol (if indicated) not indicated DVT prophylaxis: On Lovenox  GI prophylaxis: Not indicated Hyperglycemia protocol: Not indicated Mobility: Ambulate as tolerated Code Status: Full code Family Communication: We will update  Labs   CBC: Recent Labs  Lab 10/07/17 0802 10/08/17 0405 10/09/17 0813  WBC 6.5  7.2 7.1  NEUTROABS 4.4  --   --   HGB 15.3 13.7 13.7  HCT 42.4 39.7 40.7  MCV 85.3 88.6 90.8  PLT 117* 136* 199   Basic  Metabolic Panel: Recent Labs  Lab 10/07/17 0802 10/08/17 0405 10/09/17 0813  NA 131* 138 142  K 3.1* 4.3 3.9  CL 89* 97* 100  CO2 27 30 33*  GLUCOSE 124* 118* 97  BUN 12 9 9   CREATININE 0.94 0.68 1.610.63  CALCIUM 8.3* 8.5* 8.6*   GFR: Estimated Creatinine Clearance: 175.8 mL/min (by C-G formula based on SCr of 0.63 mg/dL). Recent Labs  Lab 10/07/17 0802 10/08/17 0405 10/09/17 0813  WBC 6.5 7.2 7.1  LATICACIDVEN 1.58  --   --    Liver Function Tests: Recent Labs  Lab 10/07/17 0802 10/08/17 0405 10/09/17 0813  AST 245* 174* 95*  ALT 249* 208* 147*  ALKPHOS 54 45 38  BILITOT 0.8 0.7 0.6  PROT 7.5 6.5 6.3*  ALBUMIN 3.5 2.9* 2.9*   No results for input(s): LIPASE, AMYLASE in the last 168 hours. No results for input(s): AMMONIA in the last 168 hours. ABG    Component Value Date/Time   PHART 7.465 (H) 10/07/2017 1601   PCO2ART 44.1 10/07/2017 1601   PO2ART 63.6 (L) 10/07/2017 1601   HCO3 31.3 (H) 10/07/2017 1601   O2SAT 92.3 10/07/2017 1601    Coagulation Profile: No results for input(s): INR, PROTIME in the last 168 hours. Cardiac Enzymes: No results for input(s): CKTOTAL, CKMB, CKMBINDEX, TROPONINI in the last 168 hours. HbA1C: No results found for: HGBA1C CBG: No results for input(s): GLUCAP in the last 168 hours.  Simonne MartinetPeter E Saamir Armstrong ACNP-BC [facility] Pager # [phone] OR # [phone] if no answer

## 2017-10-10 NOTE — Progress Notes (Signed)
PROGRESS NOTE    John Mccoy  ZOX:[id number] DOB: 05-May-1990 DOA: 10/07/2017 PCP: Patient, No Pcp Per   Brief Narrative: John Mccoy is a 27 y.o. male with medical history significant of seizure disorder. Patient presented secondary to shortness of breath and found to have multifocal pneumonia.   Assessment & Plan:   Active Problems:   Pneumonia   Respiratory failure with hypoxia (HCC)   Multifocal pneumonia Recent influenza B infection treated with Tamiflu. Likely current bacterial infection. No history of vaping per patient. Started on empiric vancomycin, Levaquin. Requiring significant amounts of oxygen, but is stable. MRSA pcr negative. Vancomycin discontinued. Likely component of pneumonitis -Continue oxygen, wean to room air -Pulmonology recommendations: antibiotics, prednisone -Continue Levaquin  Acute respiratory failure with hypoxia Still requiring oxygen. Down to 4 L -Continue oxygen therapy as needed. Wean to room air.  Elevated LFTs Likely secondary recent influenza infection. Improving.  Thrombocytopenia In setting of acute infection. Mild. Resolved.  Seizure disorder -Continue Keppra and Depakote  Sinus bradycardia Seen on telemetry while patient is asleep.   DVT prophylaxis: Lovenox Code Status:   Code Status: Full Code Family Communication: None at bedside Disposition Plan: Transfer to medical floor. Likely discharge in 24-48 hours   Consultants:  Critical care medicine  Procedures:   None  Antimicrobials:  Vancomycin  Levaquin   Subjective: No chest pain or dyspnea. Coughing is minimal  Objective: Vitals:   10/10/17 1300 10/10/17 1332 10/10/17 1340 10/10/17 1400  BP:   129/64   Pulse: 63 62 89 72  Resp: 19 16 (!) 22 15  Temp:      TempSrc:      SpO2: 91% 94% 94% 94%  Weight:      Height:        Intake/Output Summary (Last 24 hours) at 10/10/2017 1522 Last data filed at 10/10/2017 1140 Gross per 24 hour    Intake 120 ml  Output 4025 ml  Net -3905 ml   Filed Weights   10/07/17 0800  Weight: 102.1 kg    Examination:  General exam: Appears calm and comfortable Respiratory system: Clear to auscultation. Respiratory effort normal. Cardiovascular system: S1 & S2 heard, RRR. No murmurs, rubs, gallops or clicks. Gastrointestinal system: Abdomen is nondistended, soft and nontender. No organomegaly or masses felt. Normal bowel sounds heard. Central nervous system: Alert and oriented. No focal neurological deficits. Extremities: No edema. No calf tenderness Skin: No cyanosis. No rashes Psychiatry: Judgement and insight appear normal. Mood & affect appropriate.     Data Reviewed: I have personally reviewed following labs and imaging studies  CBC: Recent Labs  Lab 10/07/17 0802 10/08/17 0405 10/09/17 0813  WBC 6.5 7.2 7.1  NEUTROABS 4.4  --   --   HGB 15.3 13.7 13.7  HCT 42.4 39.7 40.7  MCV 85.3 88.6 90.8  PLT 117* 136* 199   Basic Metabolic Panel: Recent Labs  Lab 10/07/17 0802 10/08/17 0405 10/09/17 0813 10/10/17 0928  NA 131* 138 142 145  K 3.1* 4.3 3.9 3.9  CL 89* 97* 100 99  CO2 27 30 33* 34*  GLUCOSE 124* 118* 97 88  BUN 12 9 9 12   CREATININE 0.94 0.68 0.63 0.67  CALCIUM 8.3* 8.5* 8.6* 9.1   GFR: Estimated Creatinine Clearance: 175.8 mL/min (by C-G formula based on SCr of 0.67 mg/dL). Liver Function Tests: Recent Labs  Lab 10/07/17 0802 10/08/17 0405 10/09/17 0813  AST 245* 174* 95*  ALT 249* 208* 147*  ALKPHOS 54 45 38  BILITOT  0.8 0.7 0.6  PROT 7.5 6.5 6.3*  ALBUMIN 3.5 2.9* 2.9*   No results for input(s): LIPASE, AMYLASE in the last 168 hours. No results for input(s): AMMONIA in the last 168 hours. Coagulation Profile: No results for input(s): INR, PROTIME in the last 168 hours. Cardiac Enzymes: No results for input(s): CKTOTAL, CKMB, CKMBINDEX, TROPONINI in the last 168 hours. BNP (last 3 results) No results for input(s): PROBNP in the last 8760  hours. HbA1C: No results for input(s): HGBA1C in the last 72 hours. CBG: No results for input(s): GLUCAP in the last 168 hours. Lipid Profile: No results for input(s): CHOL, HDL, LDLCALC, TRIG, CHOLHDL, LDLDIRECT in the last 72 hours. Thyroid Function Tests: No results for input(s): TSH, T4TOTAL, FREET4, T3FREE, THYROIDAB in the last 72 hours. Anemia Panel: No results for input(s): VITAMINB12, FOLATE, FERRITIN, TIBC, IRON, RETICCTPCT in the last 72 hours. Sepsis Labs: Recent Labs  Lab 10/07/17 0802  LATICACIDVEN 1.58    Recent Results (from the past 240 hour(s))  Blood Culture (routine x 2)     Status: None (Preliminary result)   Collection Time: 10/07/17  8:02 AM  Result Value Ref Range Status   Specimen Description   Final    BLOOD BLOOD RIGHT FOREARM Performed at [facility], [address] [place], [place] [address]    Special Requests   Final    BOTTLES DRAWN AEROBIC AND ANAEROBIC Blood Culture adequate volume Performed at [facility], [address] [place], [place] [address]    Culture   Final    NO GROWTH 3 DAYS Performed at [facility], [address], [place], [place] [address]    Report Status PENDING  Incomplete  Blood Culture (routine x 2)     Status: None (Preliminary result)   Collection Time: 10/07/17  8:17 AM  Result Value Ref Range Status   Specimen Description   Final    BLOOD LEFT ANTECUBITAL Performed at [facility], [address] [place], [place] [address]    Special Requests   Final    BOTTLES DRAWN AEROBIC AND ANAEROBIC Blood Culture adequate volume Performed at [facility], [address] [place], [place] [address]    Culture   Final    NO GROWTH 3 DAYS Performed at [facility], [address] [place], [place] [address]    Report Status PENDING  Incomplete  Culture, sputum-assessment     Status: None   Collection Time: 10/07/17  3:46 PM  Result Value Ref Range Status    Specimen Description SPUTUM  Final   Special Requests NONE  Final   Sputum evaluation   Final    THIS SPECIMEN IS ACCEPTABLE FOR SPUTUM CULTURE Performed at [facility], [address], [place], [place] [address]    Report Status 10/07/2017 FINAL  Final  Culture, respiratory     Status: None   Collection Time: 10/07/17  3:46 PM  Result Value Ref Range Status   Specimen Description   Final    SPUTUM Performed at [facility], [address], [place], [place] [address]    Special Requests   Final    NONE Reflexed from [id number] Performed at [facility], [address], [place], [place] [address]    Gram Stain   Final    MODERATE WBC PRESENT,BOTH PMN AND MONONUCLEAR FEW GRAM POSITIVE RODS FEW GRAM POSITIVE COCCI  Culture   Final    FEW Consistent with normal respiratory flora. Performed at [facility], [address] [place], [place] [address]    Report Status 10/10/2017 FINAL  Final  MRSA PCR Screening     Status: None   Collection Time: 10/07/17  3:47 PM  Result Value Ref Range Status   MRSA by PCR NEGATIVE NEGATIVE Final    Comment:        The GeneXpert MRSA Assay (FDA approved for NASAL specimens only), is one component of a comprehensive MRSA colonization surveillance program. It is not intended to diagnose MRSA infection nor to guide or monitor treatment for MRSA infections. Performed at [facility], [address] [place], [place] [address]   Respiratory Panel by PCR     Status: None   Collection Time: 10/09/17  5:55 AM  Result Value Ref Range Status   Adenovirus NOT DETECTED NOT DETECTED Final   Coronavirus 229E NOT DETECTED NOT DETECTED Final   Coronavirus HKU1 NOT DETECTED NOT DETECTED Final   Coronavirus NL63 NOT DETECTED NOT DETECTED Final   Coronavirus OC43 NOT DETECTED NOT DETECTED Final   Metapneumovirus NOT DETECTED NOT DETECTED Final   Rhinovirus / Enterovirus NOT  DETECTED NOT DETECTED Final   Influenza A NOT DETECTED NOT DETECTED Final   Influenza B NOT DETECTED NOT DETECTED Final   Parainfluenza Virus 1 NOT DETECTED NOT DETECTED Final   Parainfluenza Virus 2 NOT DETECTED NOT DETECTED Final   Parainfluenza Virus 3 NOT DETECTED NOT DETECTED Final   Parainfluenza Virus 4 NOT DETECTED NOT DETECTED Final   Respiratory Syncytial Virus NOT DETECTED NOT DETECTED Final   Bordetella pertussis NOT DETECTED NOT DETECTED Final   Chlamydophila pneumoniae NOT DETECTED NOT DETECTED Final   Mycoplasma pneumoniae NOT DETECTED NOT DETECTED Final    Comment: Performed at [facility], [address] [place], [place] [address]         Radiology Studies: Dg Chest Port 1 View  Result Date: 10/09/2017 CLINICAL DATA:  Pneumonia EXAM: PORTABLE CHEST 1 VIEW COMPARISON:  10/07/2017 FINDINGS: Stable bilateral airspace disease with patchy asymmetric appearance. No Kerley lines or effusion. No pneumothorax. Normal heart size. IMPRESSION: Pneumonia without change from 2 days prior. Electronically Signed   By: Marnee SpringJonathon  Watts M.D.   On: 10/09/2017 10:44        Scheduled Meds: . divalproex  1,000 mg Oral BID AC  . divalproex  1,500 mg Oral QHS  . enoxaparin (LOVENOX) injection  40 mg Subcutaneous Q24H  . levETIRAcetam  1,000 mg Oral 2 times per day  . levETIRAcetam  1,500 mg Oral QAC breakfast  . levofloxacin  750 mg Oral Daily  . mouth rinse  15 mL Mouth Rinse BID  . predniSONE  20 mg Oral BID WC   Continuous Infusions: . sodium chloride    . sodium chloride 10 mL/hr at 10/10/17 0600     LOS: 3 days     Jacquelin Hawkingalph Jeilyn Reznik, MD [facility] 10/10/2017, 3:22 PM Pager: [phone]  If 7PM-7AM, please contact night-coverage [email or web address] 10/10/2017, 3:22 PM

## 2017-10-11 ENCOUNTER — Inpatient Hospital Stay (HOSPITAL_COMMUNITY): Payer: Self-pay

## 2017-10-11 DIAGNOSIS — R001 Bradycardia, unspecified: Secondary | ICD-10-CM

## 2017-10-11 DIAGNOSIS — G40909 Epilepsy, unspecified, not intractable, without status epilepticus: Secondary | ICD-10-CM

## 2017-10-11 DIAGNOSIS — D696 Thrombocytopenia, unspecified: Secondary | ICD-10-CM

## 2017-10-11 DIAGNOSIS — R739 Hyperglycemia, unspecified: Secondary | ICD-10-CM

## 2017-10-11 DIAGNOSIS — R945 Abnormal results of liver function studies: Secondary | ICD-10-CM

## 2017-10-11 DIAGNOSIS — R7989 Other specified abnormal findings of blood chemistry: Secondary | ICD-10-CM

## 2017-10-11 DIAGNOSIS — K5900 Constipation, unspecified: Secondary | ICD-10-CM

## 2017-10-11 LAB — COMPREHENSIVE METABOLIC PANEL
ALBUMIN: 3.2 g/dL — AB (ref 3.5–5.0)
ALT: 135 U/L — AB (ref 0–44)
AST: 75 U/L — AB (ref 15–41)
Alkaline Phosphatase: 42 U/L (ref 38–126)
Anion gap: 9 (ref 5–15)
BUN: 14 mg/dL (ref 6–20)
CHLORIDE: 98 mmol/L (ref 98–111)
CO2: 36 mmol/L — AB (ref 22–32)
CREATININE: 0.73 mg/dL (ref 0.61–1.24)
Calcium: 9.1 mg/dL (ref 8.9–10.3)
GFR calc non Af Amer: >60 mL/min (ref >60)
Glucose, Bld: 102 mg/dL — ABNORMAL HIGH (ref 70–99)
Potassium: 4.5 mmol/L (ref 3.5–5.1)
SODIUM: 143 mmol/L (ref 135–145)
Total Bilirubin: 0.8 mg/dL (ref 0.3–1.2)
Total Protein: 6.8 g/dL (ref 6.5–8.1)

## 2017-10-11 MED ORDER — IPRATROPIUM-ALBUTEROL 0.5-2.5 (3) MG/3ML IN SOLN
3.0000 mL | Freq: Four times a day (QID) | RESPIRATORY_TRACT | Status: DC
  Administered 2017-10-11: 3 mL via RESPIRATORY_TRACT
  Filled 2017-10-11: qty 3

## 2017-10-11 MED ORDER — BISACODYL 10 MG RE SUPP: 10.0000 mg | Freq: Every day | RECTAL | Status: DC | PRN

## 2017-10-11 MED ORDER — GUAIFENESIN ER 600 MG PO TB12
1200.0000 mg | ORAL_TABLET | Freq: Two times a day (BID) | ORAL | Status: DC
  Administered 2017-10-11 – 2017-10-12 (×3): 1200 mg via ORAL
  Filled 2017-10-11 (×3): qty 2

## 2017-10-11 MED ORDER — SENNOSIDES-DOCUSATE SODIUM 8.6-50 MG PO TABS
1.0000 | ORAL_TABLET | Freq: Two times a day (BID) | ORAL | Status: DC
  Administered 2017-10-11 (×2): 1 via ORAL
  Filled 2017-10-11 (×3): qty 1

## 2017-10-11 MED ORDER — POLYETHYLENE GLYCOL 3350 17 G PO PACK
17.0000 g | PACK | Freq: Two times a day (BID) | ORAL | Status: DC
  Administered 2017-10-11: 17 g via ORAL
  Filled 2017-10-11 (×3): qty 1

## 2017-10-11 MED ORDER — LEVOFLOXACIN 750 MG PO TABS
750.0000 mg | ORAL_TABLET | Freq: Every day | ORAL | Status: DC
  Administered 2017-10-12: 750 mg via ORAL
  Filled 2017-10-11: qty 1

## 2017-10-11 NOTE — Progress Notes (Signed)
PROGRESS NOTE    John Mccoy  ZOX:[id number] DOB: [birth year] DOA: 10/07/2017 PCP: Patient, No Pcp Per  Brief Narrative:  John Mccoy is a 27 y.o. malewith medical history significant ofseizure disorder. Patient presented secondary to shortness of breath and found to have multifocal pneumonia after being treated for an Influenza B infection.  Pulmonary was consulted and recommended antibiotic coverage with Levaquin and continuing steroids for 5 days.  Patient is improving however required significant amount of oxygen which is currently being weighed currently is down to 2 L of O2 via nasal cannula and was weaned off to room air however desaturated on ambulation on home O2 screen.  Will continue treatment and repeat home ambulatory screen in the a.m.  Assessment & Plan:   Active Problems:   Pneumonia   Respiratory failure with hypoxia (HCC)   Abnormal LFTs   Thrombocytopenia (HCC)   Seizure disorder (HCC)   Sinus bradycardia   Constipation   Hyperglycemia  Acute Multifocal pneumonia in the setting of Recent Influenza Infection -Recent influenza B infection treated with Tamiflu.  -Likely current superimposed bacterial infection.  -No history of vaping per patient.  -Was started on empiric vancomycin, Levaquin.  -Was Requiring significant amounts of oxygen, but is stable. -MRSA pcr negative so Vancomycin discontinued. Likely component of pneumonitis -Continue oxygen, wean to room air -Pulmonology recommendations: Antibiotics for 8 days total, prednisone -Continue Levofloxacin today -Home amatory screen done patient required oxygen -Continue to wean oxygen requirement and repeat amatory screen in a.m. -Treatment as below -Repeat chest x-ray improving  Acute Respiratory Failure with Hypoxia -Still requiring oxygen but improving. Down to 2 L this AM and was able to be weaned off of nasal cannula to room air earlier today -Continue oxygen therapy as needed. Wean to  room air at rest -Home amatory screen done and patient desaturated and had to be placed back on 1 L of O2 via nasal cannula -Chest x-ray done today showed Improved aeration bilaterally although some mild residual infiltrates are noted. -Added guaifenesin 1200 mg p.o. twice daily, flutter valve, incentive spirometer -Also scheduled DuoNebs every 6 along with as needed nebs -Continue with levofloxacin for a day total and continue with prednisone dosing until tonight -Strep pneumo urinary antigen and Legionella neutrophilia antigen was negative -Continue to monitor respiratory status and repeat home amatory screen in the a.m.  Elevated LFTs -Likely secondary recent influenza infection and use of Tamiflu. -Improving.   -AST is trended down and went from 174 is now 75 -ALT is now trending down and has gone from 208 and is now 135 -Continue to monitor liver function trend and repeat CMP in a.m.  Thrombocytopenia -In setting of acute infection.  -Was mild and is now resolved.  Platelet count is now 199,000 -Continue monitor for signs and symptoms of bleeding -Repeat CBC in a.m.  Seizure Disorder -Continue Home Keppra and Depakote dosing  Sinus Bradycardia -Seen on telemetry while patient is asleep. -Continue to Monitor on Telemetry  Constipation -Started patient on a bowel regimen with twice daily MiraLAX, senna docusate twice daily, and bisacodyl 10 mg rectally as needed  Hyperglycemia -Likely reactive in the setting of prednisone use -Check hemoglobin A1c -If blood sugars remain consistently elevated will place patient on sensitive NovoLog sliding scale AC  DVT prophylaxis: Enoxaparin 40 mg sq q24h Code Status: FULL CODE Family Communication: Discussed with Father at bedside  Disposition Plan: Anticipate D/C Home in the next 24-48 hours  Consultants:   Pulmonary/PCCM   Procedures: None  Antimicrobials: Anti-infectives (From admission, onward)   Start     Dose/Rate  Route Frequency Ordered Stop   10/12/17 1000  levofloxacin (LEVAQUIN) tablet 750 mg     750 mg Oral Daily 10/11/17 1109 10/15/17 0959   10/09/17 1000  levofloxacin (LEVAQUIN) tablet 750 mg     750 mg Oral Daily 10/08/17 1038 10/11/17 0953   10/08/17 1100  levofloxacin (LEVAQUIN) IVPB 750 mg  Status:  Discontinued     750 mg 100 mL/hr over 90 Minutes Intravenous Every 24 hours 10/07/17 1012 10/08/17 1038   10/07/17 1630  vancomycin (VANCOCIN) IVPB 1000 mg/200 mL premix  Status:  Discontinued     1,000 mg 200 mL/hr over 60 Minutes Intravenous Every 8 hours 10/07/17 1012 10/08/17 1036   10/07/17 0800  vancomycin (VANCOCIN) IVPB 1000 mg/200 mL premix  Status:  Discontinued     1,000 mg 200 mL/hr over 60 Minutes Intravenous  Once 10/07/17 0745 10/07/17 0854   10/07/17 0800  levofloxacin (LEVAQUIN) IVPB 750 mg  Status:  Discontinued     750 mg 100 mL/hr over 90 Minutes Intravenous  Once 10/07/17 0745 10/07/17 0854   10/07/17 0800  vancomycin (VANCOCIN) IVPB 1000 mg/200 mL premix     1,000 mg 200 mL/hr over 60 Minutes Intravenous STAT 10/07/17 0757 10/07/17 1021   10/07/17 0800  levofloxacin (LEVAQUIN) IVPB 750 mg     750 mg 100 mL/hr over 90 Minutes Intravenous STAT 10/07/17 0757 10/07/17 1054     Subjective: Seen and examined at bedside and states he was feeling somewhat better.  Still requiring oxygen via nasal cannula however was weaning.  No chest pain, lightheadedness or dizziness.  States that he feels constipated and has not had a proper bowel movement in several days.  No other concerns or complaints at this time.  Objective: Vitals:   10/11/17 0545 10/11/17 0600 10/11/17 1354 10/11/17 1517  BP: 122/60  126/79   Pulse: (!) 52  81   Resp: 18  20   Temp: (!) 97.5 F (36.4 C) 97.6 F (36.4 C) 98 F (36.7 C)   TempSrc: Oral Oral Oral   SpO2: 97%  91% 95%  Weight:      Height:        Intake/Output Summary (Last 24 hours) at 10/11/2017 1545 Last data filed at 10/11/2017  1100 Gross per 24 hour  Intake 240 ml  Output 950 ml  Net -710 ml   Filed Weights   10/07/17 0800  Weight: 102.1 kg   Examination: Physical Exam:  Constitutional: WN/WD, NAD and appears calm and comfortable Eyes: Lids and conjunctivae normal, sclerae anicteric  ENMT: External Ears, Nose appear normal. Grossly normal hearing. Mucous membranes are moist.  Neck: Appears normal, supple, no cervical masses, normal ROM, no appreciable thyromegaly, no JVD Respiratory: Diminished to auscultation bilaterally, no appreciable wheezing, rales, rhonchi or crackles. Normal respiratory effort and patient is not tachypenic. No accessory muscle use. Using supplemental O2 via [place].  Cardiovascular: RRR but slightly brady, no murmurs / rubs / gallops. S1 and S2 auscultated. No extremity edema. Abdomen: Soft, non-tender, Slightly distended. No masses palpated. No appreciable hepatosplenomegaly. Bowel sounds positive x4.  GU: Deferred. Musculoskeletal: No clubbing / cyanosis of digits/nails. No joint deformity upper and lower extremities. Normal strength and muscle tone.  Skin: No rashes, lesions, ulcers on a limited skin eval. No induration; Warm and dry.  Neurologic: CN 2-12 grossly intact with no focal deficits.  Romberg sign and cerebellar reflexes not assessed.  Psychiatric: Normal judgment and insight. Alert and oriented x 3. Normal mood and appropriate affect.   Data Reviewed: I have personally reviewed following labs and imaging studies  CBC: Recent Labs  Lab 10/07/17 0802 10/08/17 0405 10/09/17 0813  WBC 6.5 7.2 7.1  NEUTROABS 4.4  --   --   HGB 15.3 13.7 13.7  HCT 42.4 39.7 40.7  MCV 85.3 88.6 90.8  PLT 117* 136* 199   Basic Metabolic Panel: Recent Labs  Lab 10/07/17 0802 10/08/17 0405 10/09/17 0813 10/10/17 0928 10/11/17 0555  NA 131* 138 142 145 143  K 3.1* 4.3 3.9 3.9 4.5  CL 89* 97* 100 99 98  CO2 27 30 33* 34* 36*  GLUCOSE 124* 118* 97 88 102*  BUN 12 9 9 12 14    CREATININE 0.94 0.68 0.63 0.67 0.73  CALCIUM 8.3* 8.5* 8.6* 9.1 9.1   GFR: Estimated Creatinine Clearance: 175.8 mL/min (by C-G formula based on SCr of 0.73 mg/dL). Liver Function Tests: Recent Labs  Lab 10/07/17 0802 10/08/17 0405 10/09/17 0813 10/11/17 0555  AST 245* 174* 95* 75*  ALT 249* 208* 147* 135*  ALKPHOS 54 45 38 42  BILITOT 0.8 0.7 0.6 0.8  PROT 7.5 6.5 6.3* 6.8  ALBUMIN 3.5 2.9* 2.9* 3.2*   No results for input(s): LIPASE, AMYLASE in the last 168 hours. No results for input(s): AMMONIA in the last 168 hours. Coagulation Profile: No results for input(s): INR, PROTIME in the last 168 hours. Cardiac Enzymes: No results for input(s): CKTOTAL, CKMB, CKMBINDEX, TROPONINI in the last 168 hours. BNP (last 3 results) No results for input(s): PROBNP in the last 8760 hours. HbA1C: No results for input(s): HGBA1C in the last 72 hours. CBG: No results for input(s): GLUCAP in the last 168 hours. Lipid Profile: No results for input(s): CHOL, HDL, LDLCALC, TRIG, CHOLHDL, LDLDIRECT in the last 72 hours. Thyroid Function Tests: No results for input(s): TSH, T4TOTAL, FREET4, T3FREE, THYROIDAB in the last 72 hours. Anemia Panel: No results for input(s): VITAMINB12, FOLATE, FERRITIN, TIBC, IRON, RETICCTPCT in the last 72 hours. Sepsis Labs: Recent Labs  Lab 10/07/17 0802  LATICACIDVEN 1.58    Recent Results (from the past 240 hour(s))  Blood Culture (routine x 2)     Status: None (Preliminary result)   Collection Time: 10/07/17  8:02 AM  Result Value Ref Range Status   Specimen Description   Final    BLOOD BLOOD RIGHT FOREARM Performed at [facility], [address] [place], [place] [address]    Special Requests   Final    BOTTLES DRAWN AEROBIC AND ANAEROBIC Blood Culture adequate volume Performed at [facility], [address] [place], [place] [address]    Culture   Final    NO GROWTH 4 DAYS Performed at [facility], [address] [place], [place] [address]    Report Status PENDING  Incomplete  Blood Culture (routine x 2)     Status: None (Preliminary result)   Collection Time: 10/07/17  8:17 AM  Result Value Ref Range Status   Specimen Description   Final    BLOOD LEFT ANTECUBITAL Performed at [facility], [address] [place], [place] [address]    Special Requests   Final    BOTTLES DRAWN AEROBIC AND ANAEROBIC Blood Culture adequate volume Performed at [facility], [address], [place], [place] [address]    Culture   Final  NO GROWTH 4 DAYS Performed at [facility], [address], [place], [place] [address]    Report Status PENDING  Incomplete  Culture, sputum-assessment     Status: None   Collection Time: 10/07/17  3:46 PM  Result Value Ref Range Status   Specimen Description SPUTUM  Final   Special Requests NONE  Final   Sputum evaluation   Final    THIS SPECIMEN IS ACCEPTABLE FOR SPUTUM CULTURE Performed at [facility], [address] [place], [place] [address]    Report Status 10/07/2017 FINAL  Final  Culture, respiratory     Status: None   Collection Time: 10/07/17  3:46 PM  Result Value Ref Range Status   Specimen Description   Final    SPUTUM Performed at [facility], [address], [place], [place] [address]    Special Requests   Final    NONE Reflexed from [id number] Performed at [facility], [address], [place], [place] [address]    Gram Stain   Final    MODERATE WBC PRESENT,BOTH PMN AND MONONUCLEAR FEW GRAM POSITIVE RODS FEW GRAM POSITIVE COCCI    Culture   Final    FEW Consistent with normal respiratory flora. Performed at [facility], [address], [place], [place] [address]    Report Status 10/10/2017 FINAL  Final  MRSA PCR Screening     Status: None   Collection Time: 10/07/17  3:47 PM  Result Value Ref Range Status   MRSA by PCR NEGATIVE NEGATIVE Final     Comment:        The GeneXpert MRSA Assay (FDA approved for NASAL specimens only), is one component of a comprehensive MRSA colonization surveillance program. It is not intended to diagnose MRSA infection nor to guide or monitor treatment for MRSA infections. Performed at [facility], [address] [place], [place] [address]   Respiratory Panel by PCR     Status: None   Collection Time: 10/09/17  5:55 AM  Result Value Ref Range Status   Adenovirus NOT DETECTED NOT DETECTED Final   Coronavirus 229E NOT DETECTED NOT DETECTED Final   Coronavirus HKU1 NOT DETECTED NOT DETECTED Final   Coronavirus NL63 NOT DETECTED NOT DETECTED Final   Coronavirus OC43 NOT DETECTED NOT DETECTED Final   Metapneumovirus NOT DETECTED NOT DETECTED Final   Rhinovirus / Enterovirus NOT DETECTED NOT DETECTED Final   Influenza A NOT DETECTED NOT DETECTED Final   Influenza B NOT DETECTED NOT DETECTED Final   Parainfluenza Virus 1 NOT DETECTED NOT DETECTED Final   Parainfluenza Virus 2 NOT DETECTED NOT DETECTED Final   Parainfluenza Virus 3 NOT DETECTED NOT DETECTED Final   Parainfluenza Virus 4 NOT DETECTED NOT DETECTED Final   Respiratory Syncytial Virus NOT DETECTED NOT DETECTED Final   Bordetella pertussis NOT DETECTED NOT DETECTED Final   Chlamydophila pneumoniae NOT DETECTED NOT DETECTED Final   Mycoplasma pneumoniae NOT DETECTED NOT DETECTED Final    Comment: Performed at [facility], [address], [place], [place] [address]    Radiology Studies: Dg Chest Port 1 View  Result Date: 10/11/2017 CLINICAL DATA:  Shortness of breath and recent multifocal pneumonia EXAM: PORTABLE CHEST 1 VIEW COMPARISON:  10/09/2017 FINDINGS: Cardiac shadow is stable. Previously seen multifocal infiltrates are again noted but improved when compared with the prior exam. No new focal abnormality is seen. No bony abnormality is noted. IMPRESSION: Improved aeration bilaterally  although some mild  residual infiltrates are noted. Electronically Signed   By: Alcide Clever M.D.   On: 10/11/2017 13:18   Scheduled Meds: . divalproex  1,000 mg Oral BID AC  . divalproex  1,500 mg Oral QHS  . enoxaparin (LOVENOX) injection  40 mg Subcutaneous Q24H  . guaiFENesin  1,200 mg Oral BID  . levETIRAcetam  1,000 mg Oral 2 times per day  . levETIRAcetam  1,500 mg Oral QAC breakfast  . [START ON 10/12/2017] levofloxacin  750 mg Oral Daily  . mouth rinse  15 mL Mouth Rinse BID  . polyethylene glycol  17 g Oral BID  . predniSONE  20 mg Oral BID WC  . senna-docusate  1 tablet Oral BID   Continuous Infusions: . sodium chloride    . sodium chloride 10 mL/hr at 10/10/17 0600    LOS: 4 days   Merlene Laughter, DO [facility] PAGER is on AMION  If 7PM-7AM, please contact night-coverage [email or web address] Password [facility] 10/11/2017, 3:45 PM

## 2017-10-12 LAB — HEMOGLOBIN A1C
HEMOGLOBIN A1C: 6 % — AB (ref 4.8–5.6)
Mean Plasma Glucose: 125.5 mg/dL

## 2017-10-12 LAB — CULTURE, BLOOD (ROUTINE X 2)
CULTURE: NO GROWTH
CULTURE: NO GROWTH
Special Requests: ADEQUATE
Special Requests: ADEQUATE

## 2017-10-12 LAB — COMPREHENSIVE METABOLIC PANEL
ALK PHOS: 41 U/L (ref 38–126)
ALT: 131 U/L — AB (ref 0–44)
ANION GAP: 10 (ref 5–15)
AST: 67 U/L — ABNORMAL HIGH (ref 15–41)
Albumin: 3.2 g/dL — ABNORMAL LOW (ref 3.5–5.0)
BILIRUBIN TOTAL: 0.8 mg/dL (ref 0.3–1.2)
BUN: 13 mg/dL (ref 6–20)
CALCIUM: 9.1 mg/dL (ref 8.9–10.3)
CO2: 31 mmol/L (ref 22–32)
CREATININE: 0.61 mg/dL (ref 0.61–1.24)
Chloride: 99 mmol/L (ref 98–111)
Glucose, Bld: 105 mg/dL — ABNORMAL HIGH (ref 70–99)
Potassium: 4.4 mmol/L (ref 3.5–5.1)
Sodium: 140 mmol/L (ref 135–145)
TOTAL PROTEIN: 6.5 g/dL (ref 6.5–8.1)

## 2017-10-12 LAB — CBC WITH DIFFERENTIAL/PLATELET
BASOS ABS: 0.1 10*3/uL (ref 0.0–0.1)
Basophils Relative: 1 %
EOS PCT: 0 %
Eosinophils Absolute: 0 10*3/uL (ref 0.0–0.7)
HCT: 43.8 % (ref 39.0–52.0)
HEMOGLOBIN: 14.4 g/dL (ref 13.0–17.0)
LYMPHS PCT: 22 %
Lymphs Abs: 2.4 10*3/uL (ref 0.7–4.0)
MCH: 29.9 pg (ref 26.0–34.0)
MCHC: 32.9 g/dL (ref 30.0–36.0)
MCV: 91.1 fL (ref 78.0–100.0)
MONO ABS: 0.8 10*3/uL (ref 0.1–1.0)
MONOS PCT: 8 %
Neutro Abs: 7.4 10*3/uL (ref 1.7–7.7)
Neutrophils Relative %: 69 %
Platelets: 394 10*3/uL (ref 150–400)
RBC: 4.81 MIL/uL (ref 4.22–5.81)
RDW: 13.5 % (ref 11.5–15.5)
WBC: 10.7 10*3/uL — ABNORMAL HIGH (ref 4.0–10.5)

## 2017-10-12 LAB — MAGNESIUM: MAGNESIUM: 2.3 mg/dL (ref 1.7–2.4)

## 2017-10-12 LAB — PHOSPHORUS: PHOSPHORUS: 4.1 mg/dL (ref 2.5–4.6)

## 2017-10-12 MED ORDER — GUAIFENESIN ER 600 MG PO TB12: 600.0000 mg | ORAL_TABLET | Freq: Two times a day (BID) | ORAL | 0 refills | Status: AC

## 2017-10-12 MED ORDER — IPRATROPIUM-ALBUTEROL 0.5-2.5 (3) MG/3ML IN SOLN: 3.0000 mL | Freq: Four times a day (QID) | RESPIRATORY_TRACT | 0 refills | Status: AC | PRN

## 2017-10-12 MED ORDER — LEVOFLOXACIN 750 MG PO TABS: 750.0000 mg | ORAL_TABLET | Freq: Every day | ORAL | 0 refills | Status: AC

## 2017-10-12 NOTE — Progress Notes (Signed)
Patient given discharge instructions, and verbalized an understanding of all discharge instructions.  Patient agrees with discharge plan, and is being discharged in stable medical condition.  Patient given transportation via wheelchair. 

## 2017-10-12 NOTE — Progress Notes (Signed)
SATURATION QUALIFICATIONS: (This note is used to comply with regulatory documentation for home oxygen)  Patient Saturations on Room Air at Rest = 94%  Patient Saturations on Room Air while Ambulating = 92-98%   Patient ambulated up and back down the hall, without any shortness of breath.

## 2017-10-12 NOTE — Discharge Summary (Signed)
Physician Discharge Summary  John Mccoy ZOX:[id number] DOB: 1990/04/30 DOA: 10/07/2017  PCP: Patient, No Pcp Per  Admit date: 10/07/2017 Discharge date: 10/12/2017  Admitted From: Home Disposition: Home  Recommendations for Outpatient Follow-up:  1. Follow up with PCP in 1-2 weeks 2. Please obtain CMP/CBC, Mag, Phos  in one week 3. Repeat CXR in 3-6 weeks 4. Please follow up on the following pending results:  Home Health: No Equipment/Devices: DME Nebulizer  Discharge Condition: Stable CODE STATUS: FULL CODE  Diet recommendation: Regular Diet  Brief/Interim Summary: John Priestleyis a 27 y.o.malewith medical history significant ofseizure disorder. Patient presented secondary to shortness of breath and found to have multifocal pneumonia after being treated for an Influenza B infection.  Pulmonary was consulted and recommended antibiotic coverage with Levaquin and continuing steroids for 5 days.  Patient improved and was weaned off of supplemental oxygen.  Patient had a repeat home ambulatory screen done this morning and did not require oxygen.  He is deemed medically stable to be discharged at this time will be discharged home and will need to follow-up with primary care physician within 1 to 2 weeks and will be sending home with a DME nebulizer.  Discharge Diagnoses:  Active Problems:   Pneumonia   Respiratory failure with hypoxia (HCC)   Abnormal LFTs   Thrombocytopenia (HCC)   Seizure disorder (HCC)   Sinus bradycardia   Constipation   Hyperglycemia  Acute Multifocal pneumonia in the setting of Recent Influenza Infection -Recent influenza B infection treated with Tamiflu.  -Likely current superimposed bacterial infection.  -No history of vaping per patient.  -Was started on empiric vancomycin, Levaquin.  -Was Requiring significant amounts of oxygen, but is stable. -MRSA pcr negative so Vancomycin discontinued. Likely component of pneumonitis -Continue  oxygen, wean to room air -Pulmonology recommendations: Antibiotics for 8 days total, prednisone now complte -Continued Levofloxacin today -Home Ambulatory screen done and patient did not desaturate   -Treatment as below -Repeat chest x-ray improving -Repeat CXR in 3-6 weeks   Acute Respiratory Failure with Hypoxia, improved -Was Still requiring oxygen but improving and was able to be weaned off of nasal cannula to room air earlier  -Continue oxygen therapy as needed. Wean to room air at rest -Home Ambulatory screen done and patient desaturated yesterday and had to be placed back on 1 L of O2 via nasal cannula but did not today  -Chest x-ray done today showed Improved aeration bilaterally although some mild residual infiltrates are noted. -Added guaifenesin 1200 mg p.o. twice daily, flutter valve, incentive spirometer -Also scheduled DuoNebs every 6 along with as needed nebs -Continue with levofloxacin for 8 day total and Prednisone now stopped -Strep pneumo urinary antigen and Legionella neutrophilia antigen was negative -Continue to monitor respiratory status and repeat home amatory screen in the a.m.  Elevated LFTs -Likely secondary recent influenza infection and use of Tamiflu. -Improving.   -AST is trended down and went from 174 is now 67 -ALT is now trending down and has gone from 208 and is now 131 -Continue to monitor liver function trend and repeat CMP in a.m.  Thrombocytopenia, improved  -In setting of acute infection.  -Was mild and is now resolved.  Platelet count is now 394,000 -Continue monitor for signs and symptoms of bleeding -Repeat CBC as an outpatient   Seizure Disorder -Continue Home Keppra and Depakote dosing  Sinus Bradycardia -Seen on telemetry while patient is asleep. Dropped HR down to 42 this AM  -Continue to Monitor on Telemetry  Constipation -Started patient on a bowel regimen with twice daily MiraLAX, senna docusate twice daily, and bisacodyl  10 mg rectally as needed  Hyperglycemia in the setting of Pre-Diabetes/IFG -Likely reactive in the setting of prednisone use -Checked hemoglobin A1c and was 6.0 -If blood sugars remain consistently elevated will place patient on sensitive NovoLog sliding scale [facility]  Discharge Instructions Discharge Instructions    Call MD for:  difficulty breathing, headache or visual disturbances   Complete by:  As directed    Call MD for:  extreme fatigue   Complete by:  As directed    Call MD for:  hives   Complete by:  As directed    Call MD for:  persistant dizziness or light-headedness   Complete by:  As directed    Call MD for:  persistant nausea and vomiting   Complete by:  As directed    Call MD for:  redness, tenderness, or signs of infection (pain, swelling, redness, odor or green/yellow discharge around incision site)   Complete by:  As directed    Call MD for:  severe uncontrolled pain   Complete by:  As directed    Call MD for:  temperature >100.4   Complete by:  As directed    Diet general   Complete by:  As directed    Discharge instructions   Complete by:  As directed    You were cared for by a hospitalist during your hospital stay. If you have any questions about your discharge medications or the care you received while you were in the hospital after you are discharged, you can call the unit and ask to speak with the hospitalist on call if the hospitalist that took care of you is not available. Once you are discharged, your primary care physician will handle any further medical issues. Please note that NO REFILLS for any discharge medications will be authorized once you are discharged, as it is imperative that you return to your primary care physician (or establish a relationship with a primary care physician if you do not have one) for your aftercare needs so that they can reassess your need for medications and monitor your lab values.  Follow up with and establish with PCP. Take all  medications as prescribed. If symptoms change or worsen please return to the ED for evaluation   Increase activity slowly   Complete by:  As directed      Allergies as of 10/12/2017      Reactions   Other Swelling   Eyes, nose & mouth swell shut NSAIDS   Daucus Carota Itching, Swelling   Also Cantalope; Celery   Azithromycin Rash      Medication List    STOP taking these medications   oseltamivir 75 MG capsule Commonly known as:  TAMIFLU     TAKE these medications   divalproex 500 MG 24 hr tablet Commonly known as:  DEPAKOTE ER Take 1,000-1,500 mg by mouth 3 (three) times daily. Per Pharmacy 1000mg  QAM, 1000mg  midday, 1500mg  QPM Per Pt 1500mg  QAM, 1000mg  midday, 1500mg  QPM   guaiFENesin 600 MG 12 hr tablet Commonly known as:  MUCINEX Take 1 tablet (600 mg total) by mouth 2 (two) times daily for 3 days.   ipratropium-albuterol 0.5-2.5 (3) MG/3ML Soln Commonly known as:  DUONEB Take 3 mLs by nebulization every 6 (six) hours as needed.   levETIRAcetam 1000 MG tablet Commonly known as:  KEPPRA Take 1,000-1,500 mg by mouth 3 (three) times daily. 1500mg   QAM & 1000mg  Q afternoon & 1000mg  QPM   levofloxacin 750 MG tablet Commonly known as:  LEVAQUIN Take 1 tablet (750 mg total) by mouth daily for 2 days. Start taking on:  10/13/2017            Durable Medical Equipment  (From admission, onward)         Start     Ordered   10/11/17 1111  For home use only DME Nebulizer machine  Once    Question:  Patient needs a nebulizer to treat with the following condition  Answer:  Pneumonia   10/11/17 1110          Allergies  Allergen Reactions  . Other Swelling    Eyes, nose & mouth swell shut NSAIDS  . Daucus Carota Itching and Swelling    Also Cantalope; Celery  . Azithromycin Rash   Consultations:  Pulmonary/PCCM  Procedures/Studies: Dg Chest 2 View  Result Date: 10/07/2017 CLINICAL DATA:  Flu. Fever, increased work of breathing, shortness of breath,  hypoxia. EXAM: CHEST - 2 VIEW COMPARISON:  None. FINDINGS: Multifocal patchy opacities in the lingula and bilateral lower lobes, suspicious for pneumonia. No pleural effusion or pneumothorax. The heart is normal in size. Visualized osseous structures are within normal limits. IMPRESSION: Multifocal pneumonia, left lung predominant. Electronically Signed   By: Charline Bills M.D.   On: 10/07/2017 08:40   Dg Chest Port 1 View  Result Date: 10/11/2017 CLINICAL DATA:  Shortness of breath and recent multifocal pneumonia EXAM: PORTABLE CHEST 1 VIEW COMPARISON:  10/09/2017 FINDINGS: Cardiac shadow is stable. Previously seen multifocal infiltrates are again noted but improved when compared with the prior exam. No new focal abnormality is seen. No bony abnormality is noted. IMPRESSION: Improved aeration bilaterally although some mild residual infiltrates are noted. Electronically Signed   By: Alcide Clever M.D.   On: 10/11/2017 13:18   Dg Chest Port 1 View  Result Date: 10/09/2017 CLINICAL DATA:  Pneumonia EXAM: PORTABLE CHEST 1 VIEW COMPARISON:  10/07/2017 FINDINGS: Stable bilateral airspace disease with patchy asymmetric appearance. No Kerley lines or effusion. No pneumothorax. Normal heart size. IMPRESSION: Pneumonia without change from 2 days prior. Electronically Signed   By: Marnee Spring M.D.   On: 10/09/2017 10:44   Dg Chest Port 1 View  Result Date: 10/07/2017 CLINICAL DATA:  Flu.  Fever, hypoxia, shortness of breath EXAM: PORTABLE CHEST 1 VIEW COMPARISON:  10/07/2017 FINDINGS: Patchy bilateral airspace opacities are again noted, unchanged from earlier today. Heart is normal size. No effusions or pneumothorax. No acute bony abnormality. IMPRESSION: Patchy bilateral airspace disease compatible with multifocal pneumonia, stable. Electronically Signed   By: Charlett Nose M.D.   On: 10/07/2017 16:19    Subjective: Seen and examined at bedside and was not wearing any supplemental oxygen.  States he is  feeling well.  No chest pain, lightheadedness or dizziness.  No other concerns or complaints at this time and felt ready to go home.  Discharge Exam: Vitals:   10/11/17 2044 10/12/17 0414  BP: 127/70 127/76  Pulse: (!) 56 (!) 42  Resp: 18 18  Temp: 97.7 F (36.5 C) (!) 97.5 F (36.4 C)  SpO2: 95% 93%   Vitals:   10/11/17 1354 10/11/17 1517 10/11/17 2044 10/12/17 0414  BP: 126/79  127/70 127/76  Pulse: 81  (!) 56 (!) 42  Resp: 20  18 18   Temp: 98 F (36.7 C)  97.7 F (36.5 C) (!) 97.5 F (36.4 C)  TempSrc: Oral  Oral Oral  SpO2: 91% 95% 95% 93%  Weight:      Height:       General: Pt is alert, awake, not in acute distress Cardiovascular: RRR, S1/S2 +, no rubs, no gallops Respiratory: Diminished bilaterally, no wheezing, no rhonchi; Unlabored breathing  Abdominal: Soft, NT, ND, bowel sounds + Extremities: no edema, no cyanosis  The results of significant diagnostics from this hospitalization (including imaging, microbiology, ancillary and laboratory) are listed below for reference.    Microbiology: Recent Results (from the past 240 hour(s))  Blood Culture (routine x 2)     Status: None   Collection Time: 10/07/17  8:02 AM  Result Value Ref Range Status   Specimen Description   Final    BLOOD BLOOD RIGHT FOREARM Performed at [facility], [address] [place], [place] [address]    Special Requests   Final    BOTTLES DRAWN AEROBIC AND ANAEROBIC Blood Culture adequate volume Performed at [facility], [address] [place], [place] [address]    Culture   Final    NO GROWTH 5 DAYS Performed at [facility], [address] [place], [place] [address]    Report Status 10/12/2017 FINAL  Final  Blood Culture (routine x 2)     Status: None   Collection Time: 10/07/17  8:17 AM  Result Value Ref Range Status   Specimen Description   Final    BLOOD LEFT ANTECUBITAL Performed at [facility], [address] [place], [place]  [address]    Special Requests   Final    BOTTLES DRAWN AEROBIC AND ANAEROBIC Blood Culture adequate volume Performed at [facility], [address] [place], [place] [address]    Culture   Final    NO GROWTH 5 DAYS Performed at [facility], [address] [place], [place] [address]    Report Status 10/12/2017 FINAL  Final  Culture, sputum-assessment     Status: None   Collection Time: 10/07/17  3:46 PM  Result Value Ref Range Status   Specimen Description SPUTUM  Final   Special Requests NONE  Final   Sputum evaluation   Final    THIS SPECIMEN IS ACCEPTABLE FOR SPUTUM CULTURE Performed at [facility], [address] [place], [place] [address]    Report Status 10/07/2017 FINAL  Final  Culture, respiratory     Status: None   Collection Time: 10/07/17  3:46 PM  Result Value Ref Range Status   Specimen Description   Final    SPUTUM Performed at [facility], [address] [place], [place] [address]    Special Requests   Final    NONE Reflexed from [id number] Performed at [facility], [address] [place], [place] [address]    Gram Stain   Final    MODERATE WBC PRESENT,BOTH PMN AND MONONUCLEAR FEW GRAM POSITIVE RODS FEW GRAM POSITIVE COCCI    Culture   Final    FEW Consistent with normal respiratory flora. Performed at [facility], [address] [place], [place] [address]    Report Status 10/10/2017 FINAL  Final  MRSA PCR Screening     Status: None   Collection Time: 10/07/17  3:47 PM  Result Value Ref Range Status   MRSA by PCR NEGATIVE NEGATIVE Final    Comment:        The GeneXpert MRSA  Assay (FDA approved for NASAL specimens only), is one component of a comprehensive MRSA colonization surveillance program. It is not intended to diagnose MRSA infection nor to guide or monitor treatment for MRSA infections. Performed at [facility], [address]  [place], [place] [address]   Respiratory Panel by PCR     Status: None   Collection Time: 10/09/17  5:55 AM  Result Value Ref Range Status   Adenovirus NOT DETECTED NOT DETECTED Final   Coronavirus 229E NOT DETECTED NOT DETECTED Final   Coronavirus HKU1 NOT DETECTED NOT DETECTED Final   Coronavirus NL63 NOT DETECTED NOT DETECTED Final   Coronavirus OC43 NOT DETECTED NOT DETECTED Final   Metapneumovirus NOT DETECTED NOT DETECTED Final   Rhinovirus / Enterovirus NOT DETECTED NOT DETECTED Final   Influenza A NOT DETECTED NOT DETECTED Final   Influenza B NOT DETECTED NOT DETECTED Final   Parainfluenza Virus 1 NOT DETECTED NOT DETECTED Final   Parainfluenza Virus 2 NOT DETECTED NOT DETECTED Final   Parainfluenza Virus 3 NOT DETECTED NOT DETECTED Final   Parainfluenza Virus 4 NOT DETECTED NOT DETECTED Final   Respiratory Syncytial Virus NOT DETECTED NOT DETECTED Final   Bordetella pertussis NOT DETECTED NOT DETECTED Final   Chlamydophila pneumoniae NOT DETECTED NOT DETECTED Final   Mycoplasma pneumoniae NOT DETECTED NOT DETECTED Final    Comment: Performed at [facility], [address] [place], [place] [address]    Labs: BNP (last 3 results) No results for input(s): BNP in the last 8760 hours. Basic Metabolic Panel: Recent Labs  Lab 10/08/17 0405 10/09/17 0813 10/10/17 0928 10/11/17 0555 10/12/17 0537  NA 138 142 145 143 140  K 4.3 3.9 3.9 4.5 4.4  CL 97* 100 99 98 99  CO2 30 33* 34* 36* 31  GLUCOSE 118* 97 88 102* 105*  BUN 9 9 12 14 13   CREATININE 0.68 0.63 0.67 0.73 0.61  CALCIUM 8.5* 8.6* 9.1 9.1 9.1  MG  --   --   --   --  2.3  PHOS  --   --   --   --  4.1   Liver Function Tests: Recent Labs  Lab 10/07/17 0802 10/08/17 0405 10/09/17 0813 10/11/17 0555 10/12/17 0537  AST 245* 174* 95* 75* 67*  ALT 249* 208* 147* 135* 131*  ALKPHOS 54 45 38 42 41  BILITOT 0.8 0.7 0.6 0.8 0.8  PROT 7.5 6.5 6.3* 6.8 6.5  ALBUMIN 3.5 2.9* 2.9* 3.2* 3.2*   No results for  input(s): LIPASE, AMYLASE in the last 168 hours. No results for input(s): AMMONIA in the last 168 hours. CBC: Recent Labs  Lab 10/07/17 0802 10/08/17 0405 10/09/17 0813 10/12/17 0537  WBC 6.5 7.2 7.1 10.7*  NEUTROABS 4.4  --   --  7.4  HGB 15.3 13.7 13.7 14.4  HCT 42.4 39.7 40.7 43.8  MCV 85.3 88.6 90.8 91.1  PLT 117* 136* 199 394   Cardiac Enzymes: No results for input(s): CKTOTAL, CKMB, CKMBINDEX, TROPONINI in the last 168 hours. BNP: Invalid input(s): POCBNP CBG: No results for input(s): GLUCAP in the last 168 hours. D-Dimer No results for input(s): DDIMER in the last 72 hours. Hgb A1c Recent Labs    10/12/17 0537  HGBA1C 6.0*   Lipid Profile No results for input(s): CHOL, HDL, LDLCALC, TRIG, CHOLHDL, LDLDIRECT in the last 72 hours. Thyroid function studies No results for input(s): TSH, T4TOTAL, T3FREE, THYROIDAB in the last 72 hours.  Invalid input(s):  FREET3 Anemia work up No results for input(s): VITAMINB12, FOLATE, FERRITIN, TIBC, IRON, RETICCTPCT in the last 72 hours. Urinalysis    Component Value Date/Time   COLORURINE YELLOW 10/07/2017 0744   APPEARANCEUR CLEAR 10/07/2017 0744   LABSPEC <1.005 (L) 10/07/2017 0744   PHURINE 7.0 10/07/2017 0744   GLUCOSEU NEGATIVE 10/07/2017 0744   HGBUR NEGATIVE 10/07/2017 0744   BILIRUBINUR NEGATIVE 10/07/2017 0744   KETONESUR NEGATIVE 10/07/2017 0744   PROTEINUR NEGATIVE 10/07/2017 0744   NITRITE NEGATIVE 10/07/2017 0744   LEUKOCYTESUR NEGATIVE 10/07/2017 0744   Sepsis Labs Invalid input(s): PROCALCITONIN,  WBC,  LACTICIDVEN Microbiology Recent Results (from the past 240 hour(s))  Blood Culture (routine x 2)     Status: None   Collection Time: 10/07/17  8:02 AM  Result Value Ref Range Status   Specimen Description   Final    BLOOD BLOOD RIGHT FOREARM Performed at [facility], [address] [place], [place] [address]    Special Requests   Final    BOTTLES DRAWN AEROBIC AND ANAEROBIC Blood  Culture adequate volume Performed at [facility], [address] [place], [place] [address]    Culture   Final    NO GROWTH 5 DAYS Performed at [facility], [address], [place], [place] [address]    Report Status 10/12/2017 FINAL  Final  Blood Culture (routine x 2)     Status: None   Collection Time: 10/07/17  8:17 AM  Result Value Ref Range Status   Specimen Description   Final    BLOOD LEFT ANTECUBITAL Performed at [facility], [address] [place], [place] [address]    Special Requests   Final    BOTTLES DRAWN AEROBIC AND ANAEROBIC Blood Culture adequate volume Performed at [facility], [address] [place], [place] [address]    Culture   Final    NO GROWTH 5 DAYS Performed at [facility], [address], [place], [place] [address]    Report Status 10/12/2017 FINAL  Final  Culture, sputum-assessment     Status: None   Collection Time: 10/07/17  3:46 PM  Result Value Ref Range Status   Specimen Description SPUTUM  Final   Special Requests NONE  Final   Sputum evaluation   Final    THIS SPECIMEN IS ACCEPTABLE FOR SPUTUM CULTURE Performed at [facility], [address], [place], [place] [address]    Report Status 10/07/2017 FINAL  Final  Culture, respiratory     Status: None   Collection Time: 10/07/17  3:46 PM  Result Value Ref Range Status   Specimen Description   Final    SPUTUM Performed at [facility], [address], [place], [place] [address]    Special Requests   Final    NONE Reflexed from [id number] Performed at [facility], [address], [place], [place] [address]    Gram Stain   Final    MODERATE WBC PRESENT,BOTH PMN AND MONONUCLEAR FEW GRAM POSITIVE RODS FEW GRAM POSITIVE COCCI    Culture   Final    FEW Consistent with normal respiratory flora. Performed at [facility], [address], [place], [place] [address]    Report  Status 10/10/2017 FINAL  Final  MRSA PCR Screening     Status: None   Collection Time: 10/07/17  3:47 PM  Result Value Ref Range Status  MRSA by PCR NEGATIVE NEGATIVE Final    Comment:        The GeneXpert MRSA Assay (FDA approved for NASAL specimens only), is one component of a comprehensive MRSA colonization surveillance program. It is not intended to diagnose MRSA infection nor to guide or monitor treatment for MRSA infections. Performed at [facility], [address], [place], [place] [address]   Respiratory Panel by PCR     Status: None   Collection Time: 10/09/17  5:55 AM  Result Value Ref Range Status   Adenovirus NOT DETECTED NOT DETECTED Final   Coronavirus 229E NOT DETECTED NOT DETECTED Final   Coronavirus HKU1 NOT DETECTED NOT DETECTED Final   Coronavirus NL63 NOT DETECTED NOT DETECTED Final   Coronavirus OC43 NOT DETECTED NOT DETECTED Final   Metapneumovirus NOT DETECTED NOT DETECTED Final   Rhinovirus / Enterovirus NOT DETECTED NOT DETECTED Final   Influenza A NOT DETECTED NOT DETECTED Final   Influenza B NOT DETECTED NOT DETECTED Final   Parainfluenza Virus 1 NOT DETECTED NOT DETECTED Final   Parainfluenza Virus 2 NOT DETECTED NOT DETECTED Final   Parainfluenza Virus 3 NOT DETECTED NOT DETECTED Final   Parainfluenza Virus 4 NOT DETECTED NOT DETECTED Final   Respiratory Syncytial Virus NOT DETECTED NOT DETECTED Final   Bordetella pertussis NOT DETECTED NOT DETECTED Final   Chlamydophila pneumoniae NOT DETECTED NOT DETECTED Final   Mycoplasma pneumoniae NOT DETECTED NOT DETECTED Final    Comment: Performed at [facility], [address] [place], [place] [address]   Time coordinating discharge: 35 minutes  SIGNED:  Merlene Laughter, DO [facility] 10/12/2017, 12:14 PM Pager is on AMION  If 7PM-7AM, please contact night-coverage [email or web address] Password [email or web address]

## 2017-10-12 NOTE — Care Management Note (Signed)
Case Management Note  Patient Details  Name: John Mccoy MRN: [id number] Date of Birth: [birth year]  Subjective/Objective:                  Discharge planning  Action/Plan: dme nebulizer machine for patient at home use. No other needs present, will instruct to follow up at the [place] [facility] wellness clinic  Expected Discharge Date:  10/12/17               Expected Discharge Plan:  Home/Self Care  In-House Referral:     Discharge planning Services  CM Consult, [facility]  Post Acute Care Choice:  Durable Medical Equipment Choice offered to:  Patient  DME Arranged:  Nebulizer machine DME Agency:  [facility]  HH Arranged:    [facility] Agency:     Status of Service:  Completed, signed off  If discussed at Long Length of Stay Meetings, dates discussed:    Additional Comments:  Golda Acre, RN 10/12/2017, 1:53 PM

## 2019-08-22 ENCOUNTER — Ambulatory Visit (HOSPITAL_COMMUNITY)
Admission: RE | Admit: 2019-08-22 | Discharge: 2019-08-22 | Disposition: A | Discharge status: Home / Self Care | Payer: BLUE CROSS/BLUE SHIELD | Source: Ambulatory Visit | Attending: Family Medicine | Admitting: Family Medicine

## 2019-08-22 ENCOUNTER — Telehealth (HOSPITAL_COMMUNITY): Payer: Self-pay

## 2019-08-22 ENCOUNTER — Encounter (HOSPITAL_COMMUNITY): Payer: Self-pay

## 2019-08-22 ENCOUNTER — Other Ambulatory Visit: Payer: Self-pay

## 2019-08-22 VITALS — BP 129/84 | HR 73 | Temp 98.3°F | Resp 16

## 2019-08-22 DIAGNOSIS — H5789 Other specified disorders of eye and adnexa: Secondary | ICD-10-CM

## 2019-08-22 DIAGNOSIS — H579 Unspecified disorder of eye and adnexa: Secondary | ICD-10-CM

## 2019-08-22 MED ORDER — DEXAMETHASONE SODIUM PHOSPHATE 10 MG/ML IJ SOLN
10.0000 mg | Freq: Once | INTRAMUSCULAR | Status: AC
  Administered 2019-08-22: 10 mg via INTRAMUSCULAR

## 2019-08-22 MED ORDER — TETRACAINE HCL 0.5 % OP SOLN
OPHTHALMIC | Status: AC
  Filled 2019-08-22: qty 4

## 2019-08-22 MED ORDER — PREDNISONE 10 MG (21) PO TBPK: ORAL_TABLET | Freq: Every day | ORAL | 0 refills | Status: DC

## 2019-08-22 MED ORDER — FLUORESCEIN SODIUM 1 MG OP STRP
ORAL_STRIP | OPHTHALMIC | Status: AC
  Filled 2019-08-22: qty 1

## 2019-08-22 MED ORDER — DEXAMETHASONE SODIUM PHOSPHATE 10 MG/ML IJ SOLN
INTRAMUSCULAR | Status: AC
  Filled 2019-08-22: qty 1

## 2019-08-22 MED ORDER — PREDNISONE 10 MG (21) PO TBPK: ORAL_TABLET | Freq: Every day | ORAL | 0 refills | Status: AC

## 2019-08-22 NOTE — ED Triage Notes (Signed)
Pt c/o right eye pain and swelling onset Monday. Pt states it started as an itch and he thinks he may have scratched his eye at onset of symptoms. Pt states he also has a right sided headache that is intermittent. Pt states he recently traveled out of state. 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

## 2019-08-22 NOTE — Telephone Encounter (Signed)
Pt changed pharmacy to [facility].

## 2019-08-22 NOTE — ED Provider Notes (Signed)
[facility]   [id number] 08/22/19 Arrival Time: 0844  CC: EYE REDNESS  SUBJECTIVE:  John Mccoy is a 29 y.o. male who presents with complaint of eye redness that began gradually 2 days ago. Reports that the right eyelids are both swollen and very itchy. Denies a precipitating event, trauma, or close contacts with similar symptoms. Has not tried OTC medications for this. There are not aggravating or alleviating factors.. Denies similar symptoms in the past. Denies fever, chills, nausea, vomiting, eye pain, painful eye movements, halos, vision changes, double vision, FB sensation, periorbital erythema. Does not have ophthalmologist.  Denies contact lens use.    ROS: As per HPI.  All other pertinent ROS negative.     Past Medical History:  Diagnosis Date  . Arthritis   . Seizures (HCC)    Past Surgical History:  Procedure Laterality Date  . TONSILLECTOMY    . TYMPANOSTOMY TUBE PLACEMENT     Allergies  Allergen Reactions  . Other Swelling    Eyes, nose & mouth swell shut NSAIDS  . Daucus Carota Itching and Swelling    Also Cantalope; Celery  . Azithromycin Rash   No current facility-administered medications on file prior to encounter.   Current Outpatient Medications on File Prior to Encounter  Medication Sig Dispense Refill  . divalproex (DEPAKOTE ER) 500 MG 24 hr tablet Take 1,000-1,500 mg by mouth 3 (three) times daily. Per Pharmacy 1000mg  QAM, 1000mg  midday, 1500mg  QPM Per Pt 1500mg  QAM, 1000mg  midday, 1500mg  QPM    . levETIRAcetam (KEPPRA) 1000 MG tablet Take 1,000-1,500 mg by mouth 3 (three) times daily. 1500mg  QAM & 1000mg  Q afternoon & 1000mg  QPM  3  . ipratropium-albuterol (DUONEB) 0.5-2.5 (3) MG/3ML SOLN Take 3 mLs by nebulization every 6 (six) hours as needed. 360 mL 0   Social History   Socioeconomic History  . Marital status: Single    Spouse name: Not on file  . Number of children: Not on file  . Years of education: Not on file  . Highest  education level: Not on file  Occupational History  . Not on file  Tobacco Use  . Smoking status: Never Smoker  . Smokeless tobacco: Never Used  Substance and Sexual Activity  . Alcohol use: Not Currently  . Drug use: Not Currently  . Sexual activity: Not on file  Other Topics Concern  . Not on file  Social History Narrative  . Not on file   Social Determinants of Health   Financial Resource Strain:   . Difficulty of Paying Living Expenses:   Food Insecurity:   . Worried About in the Last Year:   . in the Last Year:   Transportation Needs:   . (Medical):   Lack of Transportation (Non-Medical):   Physical Activity:   . Days of Exercise per Week:   . Minutes of Exercise per Session:   Stress:   . Feeling of Stress :   Social Connections:   . Frequency of Communication with Friends and Family:   . Frequency of Social Gatherings with Friends and Family:   . Attends Religious Services:   . Active Member of Clubs or Organizations:   . Attends Meetings:   Marital Status:   Intimate Partner Violence:   . Fear of Current or Ex-Partner:   . Emotionally Abused:   Physically Abused:   . Sexually Abused:    Family  History  Problem Relation Age of Onset  . Healthy Father   . Diabetes Maternal Grandmother     OBJECTIVE:    Visual Acuity  Right Eye Distance: 20/30 Left Eye Distance: 20/30 Bilateral Distance: 20/20  Right Eye Near:   Left Eye Near:    Bilateral Near:      Vitals:   08/22/19 0913  BP: 129/84  Pulse: 73  Resp: 16  Temp: 98.3 F (36.8 C)  TempSrc: Oral  SpO2: 100%    General appearance: alert; no distress Eyes: R conjunctival erythema and swelling. PERRL; EOMI without discomfort;  no obvious drainage; lid everted without obvious FB;  Neck: supple Lungs: clear to auscultation bilaterally Heart: regular rate and rhythm Skin: warm and dry Psychological: alert  and cooperative; normal mood and affect   ASSESSMENT & PLAN:  1. Eye swelling, right   2. Itch of eye, right     Meds ordered this encounter  Medications  . dexamethasone (DECADRON) injection 10 mg  . predniSONE (STERAPRED UNI-PAK 21 TAB) 10 MG (21) TBPK tablet    Sig: Take by mouth daily for 6 days. Take 6 tablets on day 1, 5 tablets on day 2, 4 tablets on day 3, 3 tablets on day 4, 2 tablets on day 5, 1 tablet on day 6    Dispense:  21 tablet    Refill:  0    Order Specific Question:   Supervising Provider    Answer:   Merrilee Jansky [[id number]]       Most likely allergic dermatitis at eyelid Decadron 10mg  IM in office today Prescribed steroid taper May use artificial tears prn  Follow up with ophthalmology for further evaluation and management if symptoms persists Return or go to ER if you have any new or worsening symptoms such as fever, chills, redness, swelling, eye pain, painful eye movements, vision changes.  Reviewed expectations re: course of current medical issues. Questions answered. Outlined signs and symptoms indicating need for more acute intervention. Patient verbalized understanding. After Visit Summary given.   , NP 08/22/19 1006

## 2019-08-22 NOTE — Addendum Note (Signed)
Addended by: Meryl Crutch on: 08/22/2019 10:16 AM   Modules accepted: Orders

## 2019-08-22 NOTE — Discharge Instructions (Addendum)
I have given you a steroid injection in the office today  I have sent in a prednisone taper for you to take for 6 days. 6 tablets on day one, 5 tablets on day two, 4 tablets on day three, 3 tablets on day four, 2 tablets on day five, and 1 tablet on day six.  Take zyrtec for the next week  May take benadryl for itching as well  Follow up with this office or with primary care if symptoms are persisting

## 2019-09-10 IMAGING — DX DG CHEST 1V PORT
1 series · 1 of 1 positions shown · non-contrast
Comparison: 10/09/2017

CLINICAL DATA: Shortness of breath and recent multifocal pneumonia

EXAM:
PORTABLE CHEST 1 VIEW

[chest ap]
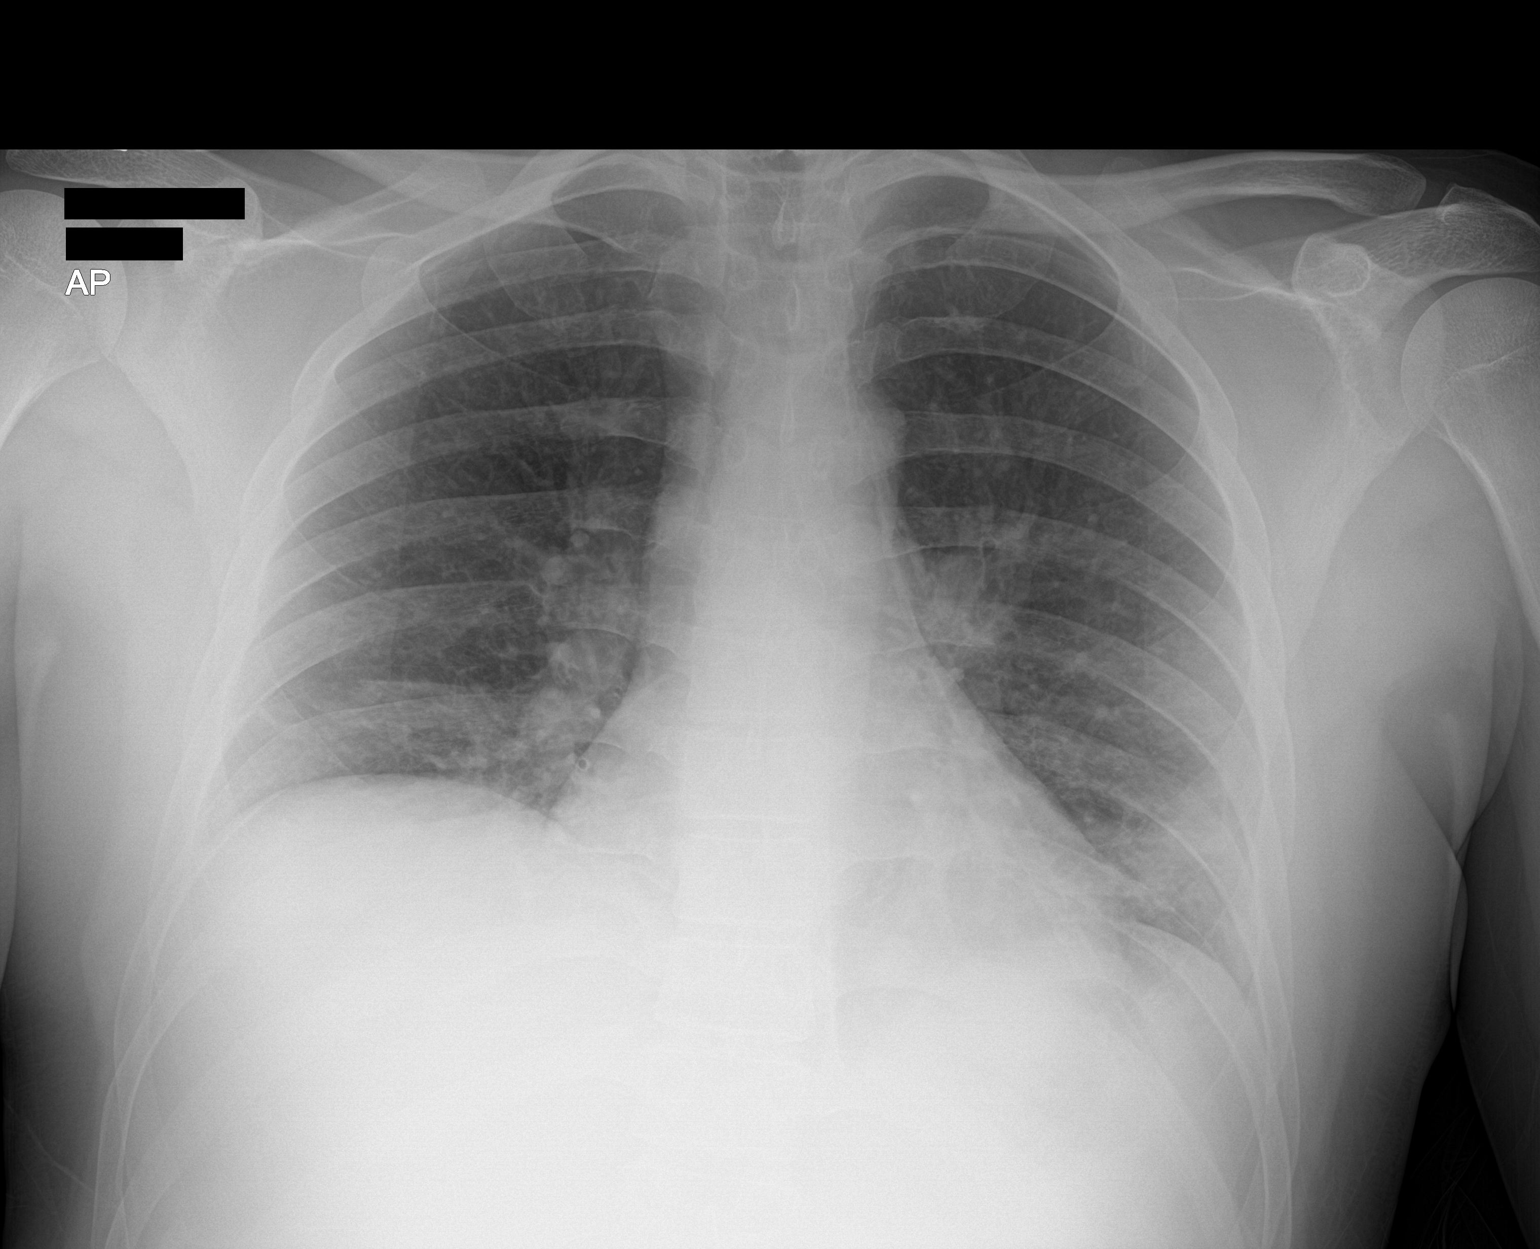

[1 of 1 positions shown; findings below may reference images not displayed]

FINDINGS: Cardiac shadow is stable. Previously seen multifocal infiltrates are
again noted but improved when compared with the prior exam. No new
focal abnormality is seen. No bony abnormality is noted.
IMPRESSION: Improved aeration bilaterally although some mild residual
infiltrates are noted.

## 2020-03-11 ENCOUNTER — Ambulatory Visit (HOSPITAL_COMMUNITY)
Admission: EM | Admit: 2020-03-11 | Discharge: 2020-03-11 | Disposition: A | Discharge status: Home / Self Care | Payer: BLUE CROSS/BLUE SHIELD

## 2020-03-11 ENCOUNTER — Other Ambulatory Visit: Payer: Self-pay

## 2020-03-11 ENCOUNTER — Encounter (HOSPITAL_COMMUNITY): Payer: Self-pay | Admitting: Emergency Medicine

## 2020-03-11 DIAGNOSIS — M5442 Lumbago with sciatica, left side: Secondary | ICD-10-CM

## 2020-03-11 DIAGNOSIS — S39012A Strain of muscle, fascia and tendon of lower back, initial encounter: Secondary | ICD-10-CM

## 2020-03-11 MED ORDER — PREDNISONE 20 MG PO TABS: ORAL_TABLET | ORAL | 0 refills | Status: AC

## 2020-03-11 MED ORDER — TIZANIDINE HCL 4 MG PO TABS: 4.0000 mg | ORAL_TABLET | Freq: Every day | ORAL | 0 refills | Status: DC

## 2020-03-11 NOTE — Discharge Instructions (Signed)
Please just use Tylenol at a dose of 500mg -650mg  once every 6 hours as needed for your aches, pains, fevers. Use this with prednisone, a steroid course, to help with your severe back pain. Use tizanidine as a muscle relaxant at bedtime.

## 2020-03-11 NOTE — ED Triage Notes (Signed)
Lower back pain started 03/04/2021.  Pain radiates into left leg.  Lifting left leg is particularly painful.  Denies urinary symptoms.  No bowel or bladder incontinence.  After carrying a heavy pool table up stairs and around corners, patient noticed pain at that time

## 2020-03-11 NOTE — ED Provider Notes (Signed)
[facility]   MRN: [id number] DOB: November 13, 1990  Subjective:   John Mccoy is a 30 y.o. male presenting for 1 week history of gradually progressing moderate to severe left-sided low back pain that radiates into his left leg. Patient states that symptoms started after he did a lot of heavy lifting, was moving and carrying a heavy pool table up the stairs and around corners. He has been using Tylenol with minimal relief. He is unable to take NSAIDs due to allergies. Denies history of back disorders, musculoskeletal disorders. Denies weakness, any specific falls, trauma.  No current facility-administered medications for this encounter.  Current Outpatient Medications:  .  divalproex (DEPAKOTE ER) 500 MG 24 hr tablet, Take 1,000-1,500 mg by mouth 3 (three) times daily. Per Pharmacy 1000mg  QAM, 1000mg  midday, 1500mg  QPM Per Pt 1500mg  QAM, 1000mg  midday, 1500mg  QPM, Disp: , Rfl:  .  ipratropium-albuterol (DUONEB) 0.5-2.5 (3) MG/3ML SOLN, Take 3 mLs by nebulization every 6 (six) hours as needed., Disp: 360 mL, Rfl: 0 .  levETIRAcetam (KEPPRA) 1000 MG tablet, Take 1,000-1,500 mg by mouth 3 (three) times daily. 1500mg  QAM & 1000mg  Q afternoon & 1000mg  QPM, Disp: , Rfl: 3   Allergies  Allergen Reactions  . Other Swelling    Eyes, nose & mouth swell shut NSAIDS  . Daucus Carota Itching and Swelling    Also Cantalope; Celery  . Azithromycin Rash    Past Medical History:  Diagnosis Date  . Arthritis   . Seizures (HCC)      Past Surgical History:  Procedure Laterality Date  . TONSILLECTOMY    . TYMPANOSTOMY TUBE PLACEMENT      Family History  Problem Relation Age of Onset  . Healthy Father   . Diabetes Maternal Grandmother     Social History   Tobacco Use  . Smoking status: Never Smoker  . Smokeless tobacco: Never Used  Substance Use Topics  . Alcohol use: Not Currently  . Drug use: Not Currently    ROS   Objective:   Vitals: BP 135/72 (BP  Location: Left Arm)   Pulse 63   Temp 97.9 F (36.6 C) (Oral)   Resp 18   SpO2 98%   Physical Exam Constitutional:      General: He is not in acute distress.    Appearance: Normal appearance. He is well-developed and normal weight. He is not ill-appearing, toxic-appearing or diaphoretic.  HENT:     Head: Normocephalic and atraumatic.     Right Ear: External ear normal.     Left Ear: External ear normal.     Nose: Nose normal.     Mouth/Throat:     Pharynx: Oropharynx is clear.  Eyes:     General: No scleral icterus.       Right eye: No discharge.        Left eye: No discharge.     Extraocular Movements: Extraocular movements intact.     Pupils: Pupils are equal, round, and reactive to light.  Cardiovascular:     Rate and Rhythm: Normal rate.  Pulmonary:     Effort: Pulmonary effort is normal.  Musculoskeletal:     Cervical back: Normal range of motion.     Lumbar back: Spasms and tenderness (Over area outlined) present. No swelling, edema, deformity, signs of trauma, lacerations or bony tenderness. Decreased range of motion. Positive left straight leg raise test. Negative right straight leg raise test. No scoliosis.       Back:  Neurological:     Mental Status: He is alert and oriented to person, place, and time.  Psychiatric:        Mood and Affect: Mood normal.        Behavior: Behavior normal.        Thought Content: Thought content normal.        Judgment: Judgment normal.    Assessment and Plan :   PDMP not reviewed this encounter.  1. Acute left-sided low back pain with left-sided sciatica   2. Strain of lumbar region, initial encounter     Suspect patient has lumbar strain but given elements of sciatica and inability to take NSAIDs will use a prednisone course. Encouraged patient to continue hydrating well, use of muscle relaxant. Counseled on back care. Follow-up with neuro spine specialist if symptoms persist. Counseled patient on potential for adverse  effects with medications prescribed/recommended today, ER and return-to-clinic precautions discussed, patient verbalized understanding.    Wallis Bamberg, PA-C 03/11/20 1431

## 2020-03-27 ENCOUNTER — Other Ambulatory Visit: Payer: Self-pay

## 2020-03-27 ENCOUNTER — Encounter (HOSPITAL_COMMUNITY): Payer: Self-pay

## 2020-03-27 ENCOUNTER — Ambulatory Visit (HOSPITAL_COMMUNITY)
Admission: EM | Admit: 2020-03-27 | Discharge: 2020-03-27 | Disposition: A | Discharge status: Home / Self Care | Payer: BLUE CROSS/BLUE SHIELD | Attending: Emergency Medicine | Admitting: Emergency Medicine

## 2020-03-27 DIAGNOSIS — M5442 Lumbago with sciatica, left side: Secondary | ICD-10-CM | POA: Diagnosis not present

## 2020-03-27 DIAGNOSIS — Z760 Encounter for issue of repeat prescription: Secondary | ICD-10-CM

## 2020-03-27 DIAGNOSIS — K921 Melena: Secondary | ICD-10-CM | POA: Diagnosis not present

## 2020-03-27 LAB — POC OCCULT BLOOD, ED: Fecal Occult Bld: POSITIVE — AB

## 2020-03-27 MED ORDER — TIZANIDINE HCL 4 MG PO TABS: 4.0000 mg | ORAL_TABLET | Freq: Every day | ORAL | 0 refills | Status: AC

## 2020-03-27 NOTE — ED Provider Notes (Signed)
[facility]    CSN: [id number] Arrival date & time: 03/27/20  1109      History   Chief Complaint Chief Complaint  Patient presents with  . Medication Refill  . Rectal Bleeding    HPI John Mccoy is a 30 y.o. male.   Patient presents with request for refill on his muscle relaxer that he was prescribed previously.  He reports ongoing left lower back pain with left-sided sciatica.  He is seeing a [facility] for this.  He reports the chiropractor instructed him to come back here to request a refill on the muscle relaxer.  He denies saddle anesthesia, loss of bowel/bladder control, lower extremity weakness.  Patient was seen here on 03/11/2020; diagnosed with acute left lower back pain with left side sciatica, strain of lumbar region; treated with prednisone and tizanidine.  Patient also reports bright red blood in his stool x3 weeks.  He denies constipation, abdominal pain, fever, or other symptoms.  He reports he normally has 3-4 stools daily with some loose stool.  His medical history includes seizures.  The history is provided by the patient and medical records.    Past Medical History:  Diagnosis Date  . Seizures [facility])     Patient Active Problem List   Diagnosis Date Noted  . Abnormal LFTs 10/11/2017  . Thrombocytopenia (HCC) 10/11/2017  . Seizure disorder (HCC) 10/11/2017  . Sinus bradycardia 10/11/2017  . Constipation 10/11/2017  . Hyperglycemia 10/11/2017  . Pneumonia 10/07/2017  . Respiratory failure with hypoxia (HCC) 10/07/2017    Past Surgical History:  Procedure Laterality Date  . TONSILLECTOMY    . TYMPANOSTOMY TUBE PLACEMENT         Home Medications    Prior to Admission medications   Medication Sig Start Date End Date Taking? Authorizing Provider  divalproex (DEPAKOTE ER) 500 MG 24 hr tablet Take 1,000-1,500 mg by mouth 3 (three) times daily. Per Pharmacy 1000mg  QAM, 1000mg  midday, 1500mg  QPM Per Pt 1500mg  QAM, 1000mg  midday,  1500mg  QPM 02/23/17   [provider]  escitalopram (LEXAPRO) 10 MG tablet Take 10 mg by mouth daily. 02/23/20   [provider]  ipratropium-albuterol (DUONEB) 0.5-2.5 (3) MG/3ML SOLN Take 3 mLs by nebulization every 6 (six) hours as needed. 10/12/17   Latif, DO  levETIRAcetam (KEPPRA) 1000 MG tablet Take 1,000-1,500 mg by mouth 3 (three) times daily. 1500mg  QAM & 1000mg  Q afternoon & 1000mg  QPM 07/19/17   [provider]  predniSONE (DELTASONE) 20 MG tablet Take 2 tablets daily with breakfast. 03/11/20   04/23/17, PA-C  tiZANidine (ZANAFLEX) 4 MG tablet Take 1 tablet (4 mg total) by mouth at bedtime. 03/27/20   10/14/17, NP    Family History Family History  Problem Relation Age of Onset  . Healthy Father   . Diabetes Maternal Grandmother     Social History Social History   Tobacco Use  . Smoking status: Never Smoker  . Smokeless tobacco: Never Used  Substance Use Topics  . Alcohol use: Yes     Allergies   Other, Daucus carota, and Azithromycin   Review of Systems Review of Systems  Constitutional: Negative for chills and fever.  HENT: Negative for ear pain and sore throat.   Eyes: Negative for pain and visual disturbance.  Respiratory: Negative for cough and shortness of breath.   Cardiovascular: Negative for chest pain and palpitations.  Gastrointestinal: Positive for blood in stool. Negative for abdominal pain, constipation, diarrhea, nausea and  vomiting.  Genitourinary: Negative for dysuria and hematuria.  Musculoskeletal: Positive for back pain. Negative for arthralgias.  Skin: Negative for color change and rash.  Neurological: Negative for syncope, weakness and numbness.  All other systems reviewed and are negative.    Physical Exam Triage Vital Signs ED Triage Vitals  Enc Vitals Group     BP      Pulse      Resp      Temp      Temp src      SpO2      Weight      Height      Head Circumference      Peak Flow       Pain Score      Pain Loc      Pain Edu?      Excl. in GC?    No data found.  Updated Vital Signs BP 128/60 (BP Location: Left Arm)   Pulse 67   Temp 98.3 F (36.8 C) (Oral)   Resp 18   SpO2 97%   Visual Acuity Right Eye Distance:   Left Eye Distance:   Bilateral Distance:    Right Eye Near:   Left Eye Near:    Bilateral Near:     Physical Exam Vitals and nursing note reviewed.  Constitutional:      General: He is not in acute distress.    Appearance: He is well-developed. He is not ill-appearing.  HENT:     Head: Normocephalic and atraumatic.     Mouth/Throat:     Mouth: Mucous membranes are moist.  Eyes:     Conjunctiva/sclera: Conjunctivae normal.  Cardiovascular:     Rate and Rhythm: Normal rate and regular rhythm.     Heart sounds: Normal heart sounds.  Pulmonary:     Effort: Pulmonary effort is normal. No respiratory distress.     Breath sounds: Normal breath sounds.  Abdominal:     General: Bowel sounds are normal.     Palpations: Abdomen is soft.     Tenderness: There is no abdominal tenderness. There is no right CVA tenderness, left CVA tenderness, guarding or rebound.     Comments: Stool light brown with bright red blood.  Genitourinary:    Rectum: Guaiac result positive.  Musculoskeletal:        General: No swelling, tenderness or deformity. Normal range of motion.     Cervical back: Neck supple.  Skin:    General: Skin is warm and dry.     Findings: No bruising, erythema, lesion or rash.  Neurological:     General: No focal deficit present.     Mental Status: He is alert and oriented to person, place, and time.     Sensory: No sensory deficit.     Motor: No weakness.     Gait: Gait normal.  Psychiatric:        Mood and Affect: Mood normal.        Behavior: Behavior normal.      UC Treatments / Results  Labs (all labs ordered are listed, but only abnormal results are displayed) Labs Reviewed  POC OCCULT BLOOD, ED - Abnormal; Notable  for the following components:      Result Value   Fecal Occult Bld POSITIVE (*)    All other components within normal limits  POC OCCULT BLOOD, ED    EKG   Radiology No results found.  Procedures Procedures (including critical care time)  Medications Ordered  in UC Medications - No data to display  Initial Impression / Assessment and Plan / UC Course  I have reviewed the triage vital signs and the nursing notes.  Pertinent labs & imaging results that were available during my care of the patient were reviewed by me and considered in my medical decision making (see chart for details).   Medication refill, acute left low back pain with left-sided sciatica.  Additional 10 days of tizanidine prescribed today; precautions for drowsiness with this medication discussed.  Hematochezia.  Guaiac positive stool.  Instructed patient to follow-up with gastroenterology as soon as possible; contact for on-call GI Dr. Loreta Ave provided.  Strict ED precautions discussed.  Instructed patient to establish a primary care provider and assistance from [facility] health requested.  Patient agrees to plan of care   Final Clinical Impressions(s) / UC Diagnoses   Final diagnoses:  Medication refill  Acute left-sided low back pain with left-sided sciatica  Hematochezia     Discharge Instructions     I sent a prescription for an additional 10 days of the muscle relaxer.  Do not drive, operate machinery, or drink alcohol with this medication as it may cause drowsiness.    Call the gastroenterologist listed below to schedule the soonest available appointment for evaluation of the blood in your stool.  Go to the emergency department if you have acute worsening symptoms.    Establish a primary care provider.  Assistance from [facility] health has been requested.          ED Prescriptions    Medication Sig Dispense Auth. Provider   tiZANidine (ZANAFLEX) 4 MG tablet Take 1 tablet (4 mg total) by mouth at bedtime. 10  tablet Mickie Bail, NP     I have reviewed the PDMP during this encounter.   Mickie Bail, NP 03/27/20 1235

## 2020-03-27 NOTE — ED Triage Notes (Signed)
Pt presents for refill on muscle relaxer because he is currently seeing a chiropractor for his back issue; pt also states that he has rectal bleeding with no pain when he has a bowel movement.

## 2020-03-27 NOTE — Discharge Instructions (Signed)
I sent a prescription for an additional 10 days of the muscle relaxer.  Do not drive, operate machinery, or drink alcohol with this medication as it may cause drowsiness.    Call the gastroenterologist listed below to schedule the soonest available appointment for evaluation of the blood in your stool.  Go to the emergency department if you have acute worsening symptoms.    Establish a primary care provider.  Assistance from [facility] health has been requested.

## 2022-03-04 DIAGNOSIS — M79672 Pain in left foot: Secondary | ICD-10-CM | POA: Diagnosis not present

## 2022-03-14 ENCOUNTER — Ambulatory Visit (INDEPENDENT_AMBULATORY_CARE_PROVIDER_SITE_OTHER): Payer: 59 | Admitting: Nurse Practitioner

## 2022-03-14 ENCOUNTER — Encounter: Payer: Self-pay | Admitting: Nurse Practitioner

## 2022-03-14 VITALS — BP 120/80 | HR 96 | Ht 72.0 in | Wt 225.0 lb

## 2022-03-14 DIAGNOSIS — G40909 Epilepsy, unspecified, not intractable, without status epilepticus: Secondary | ICD-10-CM

## 2022-03-14 DIAGNOSIS — M79672 Pain in left foot: Secondary | ICD-10-CM | POA: Diagnosis not present

## 2022-03-14 DIAGNOSIS — F1290 Cannabis use, unspecified, uncomplicated: Secondary | ICD-10-CM | POA: Diagnosis not present

## 2022-03-14 DIAGNOSIS — F419 Anxiety disorder, unspecified: Secondary | ICD-10-CM

## 2022-03-14 DIAGNOSIS — F32A Depression, unspecified: Secondary | ICD-10-CM | POA: Diagnosis not present

## 2022-03-14 MED ORDER — ESCITALOPRAM OXALATE 20 MG PO TABS: 20.0000 mg | ORAL_TABLET | Freq: Every day | ORAL | 0 refills | Status: AC

## 2022-03-14 NOTE — Progress Notes (Signed)
New Patient Office Visit  Subjective:  Patient ID: John Mccoy, male    DOB: 30-Jan-1990  Age: 32 y.o. MRN: [id number]  CC:  Chief Complaint  Patient presents with   Establish Care    Sickle cell testing    HPI Kairos Samarripa is a 32 y.o. male with past medical history of seizures, prediabetes presents to establish care.No previous PCP.  He is currently followed by neurology at [facility] for seizures, he denies any recent seizure activity.  He would like to have labs done  to check his genotype ,he is getting into a relationship with a black lady. He was encouraged to request for his birth records at the hospital since he should have had this labs done at birth.   Anxiety and depression. Currently on lexapro '10mg'$  daily. Stated that he saw therapist in the past for his condition. He stated that '' whenever I start doing well, everything starts to fall apart;;, he take marijuana occasionally to help relieve his anxiety    Left foot pain. Stated that he accidentally dropped a sledge on his left  foot 3 days ago at home, he was seen by orthopedics at [facility], right foot is bruised and sore, has compression wrap on. He denies numbness , tingling.    Due for TDAP vaccine, Influenza vaccine, he was encouraged to check his vaccine record, get the TDAP vaccine if due, he stated that he will get the influenza vaccine at the pharmacy.   Past Medical History:  Diagnosis Date   Epilepsy ([place])    Seizures ([place])     Past Surgical History:  Procedure Laterality Date   TONSILLECTOMY     TYMPANOSTOMY TUBE PLACEMENT      Family History  Problem Relation Age of Onset   Healthy Father    Diabetes Maternal Grandmother     Social History   Socioeconomic History   Marital status: Single    Spouse name: Not on file   Number of children: Not on file   Years of education: Not on file   Highest education level: Not on file  Occupational History   Not on file  Tobacco Use    Smoking status: Never   Smokeless tobacco: Never  Substance and Sexual Activity   Alcohol use: Yes    Comment: occasssionlly   Drug use: Yes    Types: Marijuana   Sexual activity: Not Currently  Other Topics Concern   Not on file  Social History Narrative   Lives home alone.    Social Determinants of Health   Financial Resource Strain: Not on file  Food Insecurity: Not on file  Transportation Needs: Not on file  Physical Activity: Not on file  Stress: Not on file  Social Connections: Not on file  Intimate Partner Violence: Not on file    ROS Review of Systems  Constitutional: Negative.   Respiratory: Negative.    Cardiovascular: Negative.   Musculoskeletal:  Positive for joint swelling. Negative for arthralgias, back pain, gait problem, myalgias, neck pain and neck stiffness.  Neurological: Negative.   Psychiatric/Behavioral:  Negative for agitation, behavioral problems, confusion, decreased concentration, self-injury and suicidal ideas.     Objective:   Today's Vitals: BP 120/80   Pulse 96   Ht 6' (1.829 m)   Wt 225 lb (102.1 kg)   SpO2 99%   BMI 30.52 kg/m   Physical Exam Constitutional:      General: He is not in acute distress.  Appearance: Normal appearance. He is obese. He is not ill-appearing, toxic-appearing or diaphoretic.  Eyes:     General: No scleral icterus.       Right eye: No discharge.        Left eye: No discharge.  Cardiovascular:     Rate and Rhythm: Normal rate and regular rhythm.     Pulses: Normal pulses.     Heart sounds: Normal heart sounds. No murmur heard.    No friction rub. No gallop.  Pulmonary:     Effort: Pulmonary effort is normal. No respiratory distress.     Breath sounds: Normal breath sounds. No stridor. No wheezing, rhonchi or rales.  Chest:     Chest wall: No tenderness.  Abdominal:     General: There is no distension.     Palpations: Abdomen is soft.     Tenderness: There is no abdominal tenderness. There is  no guarding.  Musculoskeletal:        General: Tenderness present. No deformity.     Right lower leg: Edema present.     Left lower leg: No edema.     Comments: Left foot swollen and tender, ecchymosis noted.  Has Ace wrap, Ortho boot in place.  Skin warm and dry sensation intact  Neurological:     Mental Status: He is alert and oriented to person, place, and time.     Sensory: No sensory deficit.     Coordination: Coordination normal.     Gait: Gait abnormal.  Psychiatric:        Mood and Affect: Mood normal.        Behavior: Behavior normal.        Thought Content: Thought content normal.        Judgment: Judgment normal.     Assessment & Plan:   Problem List Items Addressed This Visit       Nervous and Auditory   Seizure disorder ([place])    Currently on  keppra and depakote Continue current medications Maintain close follow up with neurology.         Other   Anxiety and depression - Primary       03/14/2022    9:48 AM  GAD 7 : Generalized Anxiety Score  Nervous, Anxious, on Edge 3  Control/stop worrying 1  Worry too much - different things 2  Trouble relaxing 2  Restless 3  Easily annoyed or irritable 3  Afraid - awful might happen 1  Total GAD 7 Score 15  Anxiety Difficulty Not difficult at all   [facility] Visit from 03/14/2022 in [facility]  PHQ-9 Total Score 11      Currently uncontrolled on lexapro '10mg'$  daily Start lexapro '20mg'$  daily  He denies SI, HI  He is interested in counseling, referral made today         Relevant Medications   escitalopram (LEXAPRO) 20 MG tablet   Left foot pain    Due to injury 3 days ago Continue application of compression wrap and boot Take OTC tylenol as needed Follow up with othopedics       Marijuana smoker, episodic    Need to avoid smoking marijuana including risk of addiction to illicit drugs discussed       Outpatient Encounter Medications as of 03/14/2022  Medication Sig    divalproex (DEPAKOTE ER) 500 MG 24 hr tablet Take 1,000-1,500 mg by mouth 3 (three) times daily. Per Pharmacy '1000mg'$  QAM, '1000mg'$  midday, '1500mg'$  QPM  Per Pt '1500mg'$  QAM, '1000mg'$  midday, '1500mg'$  QPM   escitalopram (LEXAPRO) 20 MG tablet Take 1 tablet (20 mg total) by mouth daily.   ipratropium-albuterol (DUONEB) 0.5-2.5 (3) MG/3ML SOLN Take 3 mLs by nebulization every 6 (six) hours as needed.   levETIRAcetam (KEPPRA) 1000 MG tablet Take 1,000-1,500 mg by mouth 3 (three) times daily. '1500mg'$  QAM & '1000mg'$  Q afternoon & '1000mg'$  QPM   [DISCONTINUED] escitalopram (LEXAPRO) 10 MG tablet Take 10 mg by mouth daily.   predniSONE (DELTASONE) 20 MG tablet Take 2 tablets daily with breakfast. (Patient not taking: Reported on 03/14/2022)   tiZANidine (ZANAFLEX) 4 MG tablet Take 1 tablet (4 mg total) by mouth at bedtime. (Patient not taking: Reported on 03/14/2022)   No facility-administered encounter medications on file as of 03/14/2022.    Follow-up: Return in about 4 weeks (around 04/11/2022) for CPE.   Renee Rival, FNP

## 2022-03-14 NOTE — Assessment & Plan Note (Signed)
    03/14/2022    9:48 AM  GAD 7 : Generalized Anxiety Score  Nervous, Anxious, on Edge 3  Control/stop worrying 1  Worry too much - different things 2  Trouble relaxing 2  Restless 3  Easily annoyed or irritable 3  Afraid - awful might happen 1  Total GAD 7 Score 15  Anxiety Difficulty Not difficult at all   [facility] Visit from 03/14/2022 in [facility]  PHQ-9 Total Score 11      Currently uncontrolled on lexapro 17m daily Start lexapro 221mdaily  He denies SI, HI  He is interested in counseling, referral made today

## 2022-03-14 NOTE — Assessment & Plan Note (Signed)
Need to avoid smoking marijuana including risk of addiction to illicit drugs discussed

## 2022-03-14 NOTE — Assessment & Plan Note (Signed)
Currently on  keppra and depakote Continue current medications Maintain close follow up with neurology.

## 2022-03-14 NOTE — Patient Instructions (Addendum)
Please get your TDAP vaccine if you have not done so within the past 10 years.  1. Anxiety and depression  - escitalopram (LEXAPRO) 20 MG tablet; Take 1 tablet (20 mg total) by mouth daily.  Dispense: 90 tablet; Refill: 0   It is important that you exercise regularly at least 30 minutes 5 times a week as tolerated  Think about what you will eat, plan ahead. Choose " clean, green, fresh or frozen" over canned, processed or packaged foods which are more sugary, salty and fatty. 70 to 75% of food eaten should be vegetables and fruit. Three meals at set times with snacks allowed between meals, but they must be fruit or vegetables. Aim to eat over a 12 hour period , example 7 am to 7 pm, and STOP after  your last meal of the day. Drink water,generally about 64 ounces per day, no other drink is as healthy. Fruit juice is best enjoyed in a healthy way, by EATING the fruit.  Thanks for choosing [facility] we consider it a privelige to serve you.

## 2022-03-14 NOTE — Assessment & Plan Note (Signed)
Due to injury 3 days ago Continue application of compression wrap and boot Take OTC tylenol as needed Follow up with othopedics

## 2022-04-11 ENCOUNTER — Ambulatory Visit: Payer: Self-pay | Admitting: Nurse Practitioner

## 2022-10-18 DIAGNOSIS — M940 Chondrocostal junction syndrome [Tietze]: Secondary | ICD-10-CM | POA: Diagnosis not present

## 2022-10-18 DIAGNOSIS — U071 COVID-19: Secondary | ICD-10-CM | POA: Diagnosis not present

## 2022-12-21 DIAGNOSIS — Z886 Allergy status to analgesic agent status: Secondary | ICD-10-CM | POA: Diagnosis not present

## 2022-12-21 DIAGNOSIS — F419 Anxiety disorder, unspecified: Secondary | ICD-10-CM | POA: Diagnosis not present

## 2022-12-21 DIAGNOSIS — G40909 Epilepsy, unspecified, not intractable, without status epilepticus: Secondary | ICD-10-CM | POA: Diagnosis not present

## 2022-12-21 DIAGNOSIS — E669 Obesity, unspecified: Secondary | ICD-10-CM | POA: Diagnosis not present

## 2022-12-21 DIAGNOSIS — Z6830 Body mass index (BMI) 30.0-30.9, adult: Secondary | ICD-10-CM | POA: Diagnosis not present

## 2023-06-01 DIAGNOSIS — G40219 Localization-related (focal) (partial) symptomatic epilepsy and epileptic syndromes with complex partial seizures, intractable, without status epilepticus: Secondary | ICD-10-CM | POA: Diagnosis not present

## 2023-06-02 DIAGNOSIS — Z833 Family history of diabetes mellitus: Secondary | ICD-10-CM | POA: Diagnosis not present

## 2023-06-02 DIAGNOSIS — Z8249 Family history of ischemic heart disease and other diseases of the circulatory system: Secondary | ICD-10-CM | POA: Diagnosis not present

## 2023-06-02 DIAGNOSIS — R030 Elevated blood-pressure reading, without diagnosis of hypertension: Secondary | ICD-10-CM | POA: Diagnosis not present

## 2023-06-02 DIAGNOSIS — F329 Major depressive disorder, single episode, unspecified: Secondary | ICD-10-CM | POA: Diagnosis not present

## 2023-06-02 DIAGNOSIS — G40909 Epilepsy, unspecified, not intractable, without status epilepticus: Secondary | ICD-10-CM | POA: Diagnosis not present

## 2023-06-02 DIAGNOSIS — Z886 Allergy status to analgesic agent status: Secondary | ICD-10-CM | POA: Diagnosis not present

## 2023-06-02 DIAGNOSIS — F419 Anxiety disorder, unspecified: Secondary | ICD-10-CM | POA: Diagnosis not present

## 2023-08-31 DIAGNOSIS — M545 Low back pain, unspecified: Secondary | ICD-10-CM | POA: Diagnosis not present

## 2023-08-31 DIAGNOSIS — M25552 Pain in left hip: Secondary | ICD-10-CM | POA: Diagnosis not present

## 2023-10-26 DIAGNOSIS — F339 Major depressive disorder, recurrent, unspecified: Secondary | ICD-10-CM | POA: Diagnosis not present

## 2023-10-26 DIAGNOSIS — G40219 Localization-related (focal) (partial) symptomatic epilepsy and epileptic syndromes with complex partial seizures, intractable, without status epilepticus: Secondary | ICD-10-CM | POA: Diagnosis not present
